# Patient Record
Sex: Male | Born: 1985 | Race: Black or African American | Hispanic: No | Marital: Married | Smoking: Never smoker
Health system: Southern US, Community
[De-identification: ages and names within clinical notes are randomized; demographics above are authoritative.]

## PROBLEM LIST (undated history)

## (undated) ENCOUNTER — Ambulatory Visit (HOSPITAL_COMMUNITY): Admission: EM | Payer: Self-pay | Source: Home / Self Care

## (undated) DIAGNOSIS — IMO0001 Reserved for inherently not codable concepts without codable children: Secondary | ICD-10-CM

## (undated) DIAGNOSIS — Z9289 Personal history of other medical treatment: Secondary | ICD-10-CM

## (undated) DIAGNOSIS — K219 Gastro-esophageal reflux disease without esophagitis: Secondary | ICD-10-CM

## (undated) HISTORY — DX: Reserved for inherently not codable concepts without codable children: IMO0001

## (undated) HISTORY — DX: Gastro-esophageal reflux disease without esophagitis: K21.9

## (undated) HISTORY — PX: APPENDECTOMY: SHX54

## (undated) HISTORY — DX: Personal history of other medical treatment: Z92.89

---
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
## 2007-01-20 ENCOUNTER — Ambulatory Visit: Payer: Self-pay | Admitting: Internal Medicine

## 2007-02-15 ENCOUNTER — Ambulatory Visit: Payer: Self-pay | Admitting: Internal Medicine

## 2007-02-18 ENCOUNTER — Ambulatory Visit: Payer: Self-pay | Admitting: Internal Medicine

## 2007-07-20 ENCOUNTER — Encounter (INDEPENDENT_AMBULATORY_CARE_PROVIDER_SITE_OTHER): Payer: Self-pay | Admitting: Urology

## 2007-07-20 ENCOUNTER — Ambulatory Visit (HOSPITAL_BASED_OUTPATIENT_CLINIC_OR_DEPARTMENT_OTHER): Admission: RE | Admit: 2007-07-20 | Discharge: 2007-07-20 | Payer: Self-pay | Admitting: Urology

## 2009-01-12 ENCOUNTER — Encounter (INDEPENDENT_AMBULATORY_CARE_PROVIDER_SITE_OTHER): Payer: Self-pay | Admitting: Adult Health

## 2009-01-12 ENCOUNTER — Ambulatory Visit: Payer: Self-pay | Admitting: Internal Medicine

## 2009-01-12 LAB — CONVERTED CEMR LAB
Chlamydia, Swab/Urine, PCR: POSITIVE — AB
GC Probe Amp, Urine: NEGATIVE

## 2009-01-13 ENCOUNTER — Encounter (INDEPENDENT_AMBULATORY_CARE_PROVIDER_SITE_OTHER): Payer: Self-pay | Admitting: Adult Health

## 2009-04-17 ENCOUNTER — Ambulatory Visit: Payer: Self-pay | Admitting: Internal Medicine

## 2009-04-17 ENCOUNTER — Encounter (INDEPENDENT_AMBULATORY_CARE_PROVIDER_SITE_OTHER): Payer: Self-pay | Admitting: Adult Health

## 2009-04-17 LAB — CONVERTED CEMR LAB
AST: 25 units/L (ref 0–37)
Albumin: 4.8 g/dL (ref 3.5–5.2)
Alkaline Phosphatase: 44 units/L (ref 39–117)
BUN: 8 mg/dL (ref 6–23)
Basophils Absolute: 0 10*3/uL (ref 0.0–0.1)
Eosinophils Absolute: 0.1 10*3/uL (ref 0.0–0.7)
Eosinophils Relative: 1 % (ref 0–5)
GC Probe Amp, Urine: NEGATIVE
Glucose, Bld: 89 mg/dL (ref 70–99)
Lymphocytes Relative: 45 % (ref 12–46)
Lymphs Abs: 1.7 10*3/uL (ref 0.7–4.0)
Neutrophils Relative %: 45 % (ref 43–77)
Platelets: 210 10*3/uL (ref 150–400)
Potassium: 4 meq/L (ref 3.5–5.3)
RDW: 13.2 % (ref 11.5–15.5)
Sodium: 142 meq/L (ref 135–145)
Total Bilirubin: 0.4 mg/dL (ref 0.3–1.2)
WBC: 3.9 10*3/uL — ABNORMAL LOW (ref 4.0–10.5)

## 2010-05-22 ENCOUNTER — Ambulatory Visit: Payer: Self-pay

## 2010-05-22 ENCOUNTER — Other Ambulatory Visit: Payer: Self-pay | Admitting: Occupational Medicine

## 2010-05-22 DIAGNOSIS — R7611 Nonspecific reaction to tuberculin skin test without active tuberculosis: Secondary | ICD-10-CM

## 2010-07-16 NOTE — Op Note (Signed)
NAMEBaker Vargas             ACCOUNT NO.:  [id number]   MEDICAL RECORD NO.:  [id number]          PATIENT TYPE:  AMB   LOCATION:  [facility]                         FACILITY:  [facility]   PHYSICIAN:  Lindaann Slough, M.D.  DATE OF BIRTH:  1985/09/27   DATE OF PROCEDURE:  07/20/2007  DATE OF DISCHARGE:                               OPERATIVE REPORT   PREOPERATIVE DIAGNOSIS:  Left orchialgia.   POSTOPERATIVE DIAGNOSIS:  Left orchialgia.   PROCEDURE DONE:  Cystoscopy, exploration of left scrotum, excision of  left appendix testis.   SURGEON:  Danae Chen, M.D.   ANESTHESIA:  General.   INDICATIONS:  The patient is a 25 year old male who has been complaining  of left testicular pain for several years.  He had a left inguinal  hernia repair about 6 years ago, and since then he has been complaining  of left testicular pain.  He was treated for left epididymitis, and the  pain has persisted.  Scrotal ultrasound showed a 0.2 cm cyst at the head  of the left epididymis.  He continued to complain of pain in spite of  scrotal block, and he insisted that the pain is coming from the upper  pole of the left testis where a small mobile structure is palpable.  I  spent several hours discussing with him about the epididymal cyst.  I  have told him that most of the time they do not cause any pain, and I do  not know if exploring the testicle and removing that structure would  relieve the pain, but he insists on having it out because he feels that  that is the cause of his pain.  He understands that he still could have  pain after the procedure, and he is willing to proceed.  That was  discussed also with Carmelia Bake, who looks after the refugees. And  he is scheduled today for the procedure.   Under general anesthesia, the patient was prepped and draped and placed  in the supine position.  The scrotum was infiltrated with 0.25%  Marcaine.  Then, a longitudinal incision was made on the scrotum.   The  incision was carried down to the tunica vaginalis, which was then  incised.  The testicle was then delivered through the wound.  The  appendix testis is present, and there is no appendix epididymis.  There  is no other structure than the appendix testis  at the upper pole of the  testis.  The appendix testis was then excised.  I carefully examined the  testicle, and I could not feel any mass in the testicle or in the  epididymis, and the cord is otherwise normal.  I carefully inspected the  tunica albuginea, and there was no evidence of cysts on the tunica.  Hemostasis was then secured with  electrocautery.  The wound was irrigated with normal saline, and the  scrotum was closed in two layers with #3-0 Vicryl.   The patient tolerated the procedure well and left the O.R. in  satisfactory condition to the post anesthesia care unit.      Marc-Henry Nesi,  M.D.  Electronically Signed     MN/MEDQ  D:  07/20/2007  T:  07/20/2007  Job:  [id number]   cc:   Fleet Contras, M.D.  Fax: [phone]

## 2010-11-27 LAB — POCT HEMOGLOBIN-HEMACUE: Operator id: 114531

## 2011-03-27 ENCOUNTER — Ambulatory Visit (INDEPENDENT_AMBULATORY_CARE_PROVIDER_SITE_OTHER): Payer: Self-pay | Admitting: Internal Medicine

## 2011-03-27 ENCOUNTER — Encounter: Payer: Self-pay | Admitting: Internal Medicine

## 2011-03-27 DIAGNOSIS — R079 Chest pain, unspecified: Secondary | ICD-10-CM

## 2011-03-27 NOTE — Patient Instructions (Signed)
STOP lifting weights.  Take Motrin 400mg   2 times per day for 1 week then  Take Motrin 200mg  2 times per day times 3 weeks.

## 2011-03-27 NOTE — Assessment & Plan Note (Signed)
Pain is atypical for cardiac.  Sounds more musculoskeletal.  That he can run for 30 min without a pain at times suggest a noncardiac source for pain. I would recomm Ibuprofen bid for several wks.  Take protonix.  Stop lifting wts.  Run as tolerated.  Call in a few wks to report response EKG done today is normal.  No evid of RVH or chamber hypertrophy.  Exam supports this.   Will be available as needed.

## 2011-03-27 NOTE — Progress Notes (Signed)
HPI Patient is a 26 year old who is referred for evaluation of CP. The patient has no known cardiac problems.  He says that since about Oct/November he has had episodes of SSchest pressure/L parasternal chest pressure.  Episodes occur with wt lifting.  Occasional can occur (mild) with running.  He can run/jog for 30 min without a problem though. Pain is not pleuritic.  Can be exacerbated if he does a flys with weights.  Lifts bout 40 lb with each hand. No fever, cough. This summer, early fall he was very active playing soccer for up to 90 min at  A time.  No Known Allergies  Current Outpatient Prescriptions  Medication Sig Dispense Refill  . pantoprazole (PROTONIX) 40 MG tablet Take 40 mg by mouth 2 (two) times daily.      . traMADol (ULTRAM) 50 MG tablet Take 50 mg by mouth every 6 (six) hours as needed.        Past Medical History  Diagnosis Date  . Chest pain   . Reflux     No past surgical history on file.  No family history on file.  History   Social History  . Marital Status: Single    Spouse Name: N/A    Number of Children: N/A  . Years of Education: N/A   Occupational History  . Not on file.   Social History Main Topics  . Smoking status: Never Smoker   . Smokeless tobacco: Not on file  . Alcohol Use: Not on file  . Drug Use: Not on file  . Sexually Active: Not on file   Other Topics Concern  . Not on file   Social History Narrative  . No narrative on file    Review of Systems:  All systems reviewed.  They are negative to the above problem except as previously stated.  Vital Signs: BP 132/78  Pulse 68  Ht 5\' 11"  (1.803 m)  Wt 192 lb (87.091 kg)  BMI 26.78 kg/m2  Physical Exam  Patient is in NAD HEENT:  Normocephalic, atraumatic. EOMI, PERRLA.  Neck: JVP is normal. No thyromegaly. No bruits.  Lungs: clear to auscultation. No rales no wheezes.  Heart: Regular rate and rhythm. Normal S1, S2. No S3.   No significant murmurs. PMI not displaced.No RV  heave Chest:  Mild chest pressure with palpitation of sternum.  Abdomen:  Supple, nontender. Normal bowel sounds. No masses. No hepatomegaly.  Extremities:   Good distal pulses throughout. No lower extremity edema.  Musculoskeletal :moving all extremities.  Neuro:   alert and oriented x3.  CN II-XII grossly intact.   EKG:  Sinus rhythm.  68 bpm.  Assessment and Plan:  ]

## 2012-09-16 ENCOUNTER — Other Ambulatory Visit (HOSPITAL_COMMUNITY): Payer: Self-pay | Admitting: Physician Assistant

## 2012-09-16 ENCOUNTER — Encounter (HOSPITAL_COMMUNITY): Payer: Self-pay | Admitting: Emergency Medicine

## 2012-09-16 ENCOUNTER — Emergency Department (HOSPITAL_COMMUNITY): Payer: BC Managed Care – PPO

## 2012-09-16 ENCOUNTER — Emergency Department (HOSPITAL_COMMUNITY)
Admission: EM | Admit: 2012-09-16 | Discharge: 2012-09-16 | Disposition: A | Discharge status: Home / Self Care | Payer: BC Managed Care – PPO | Attending: Emergency Medicine | Admitting: Emergency Medicine

## 2012-09-16 ENCOUNTER — Other Ambulatory Visit: Payer: Self-pay

## 2012-09-16 DIAGNOSIS — R079 Chest pain, unspecified: Secondary | ICD-10-CM

## 2012-09-16 DIAGNOSIS — R071 Chest pain on breathing: Secondary | ICD-10-CM | POA: Insufficient documentation

## 2012-09-16 DIAGNOSIS — R072 Precordial pain: Secondary | ICD-10-CM

## 2012-09-16 DIAGNOSIS — Z8719 Personal history of other diseases of the digestive system: Secondary | ICD-10-CM | POA: Insufficient documentation

## 2012-09-16 LAB — CBC
Hemoglobin: 15.9 g/dL (ref 13.0–17.0)
Platelets: 225 10*3/uL (ref 150–400)
RBC: 5.57 MIL/uL (ref 4.22–5.81)
WBC: 5.3 10*3/uL (ref 4.0–10.5)

## 2012-09-16 LAB — BASIC METABOLIC PANEL
BUN: 17 mg/dL (ref 6–23)
Chloride: 100 mEq/L (ref 96–112)
Creatinine, Ser: 1.03 mg/dL (ref 0.50–1.35)
GFR calc Af Amer: >90 mL/min (ref >90)
GFR calc non Af Amer: >90 mL/min (ref >90)
Potassium: 5.2 mEq/L — ABNORMAL HIGH (ref 3.5–5.1)
Sodium: 137 mEq/L (ref 135–145)

## 2012-09-16 MED ORDER — MORPHINE SULFATE 4 MG/ML IJ SOLN
4.0000 mg | Freq: Once | INTRAMUSCULAR | Status: AC
  Administered 2012-09-16: 4 mg via INTRAVENOUS
  Filled 2012-09-16: qty 1

## 2012-09-16 MED ORDER — IBUPROFEN 800 MG PO TABS: 800.0000 mg | ORAL_TABLET | Freq: Three times a day (TID) | ORAL | Status: DC

## 2012-09-16 MED ORDER — KETOROLAC TROMETHAMINE 30 MG/ML IJ SOLN
30.0000 mg | Freq: Once | INTRAMUSCULAR | Status: AC
  Administered 2012-09-16: 30 mg via INTRAVENOUS
  Filled 2012-09-16: qty 1

## 2012-09-16 NOTE — ED Provider Notes (Signed)
History    CSN: [id number] Arrival date & time 09/16/12  0207  None    Chief Complaint  Patient presents with  . Chest Pain   (Consider location/radiation/quality/duration/timing/severity/associated sxs/prior Treatment) HPI History provided by pt.   Pt c/o migrating but non-radiating L anterolateral CP.  Pain intermittent x 1 year.  Before today, it has only occurred with exertion, initially with running and lifting, eventually with walking but not every time, and tonight at rest while trying to fall asleep.  Tonight the pain is aggravated by laying on his L side as well.  No associated fever, cough, SOB, abd pain, LE edema/pain.  No RF for PE.  Per prior chart, pt had a stress echo in 03/2011 and pain determined to be musculoskeletal.   No PMH.  No FH cardiac disease.   Past Medical History  Diagnosis Date  . Chest pain   . Reflux    No past surgical history on file. No family history on file. History  Substance Use Topics  . Smoking status: Never Smoker   . Smokeless tobacco: Not on file  . Alcohol Use: No    Review of Systems  All other systems reviewed and are negative.    Allergies  Review of patient's allergies indicates no known allergies.  Home Medications   Current Outpatient Rx  Name  Route  Sig  Dispense  Refill  . fish oil-omega-3 fatty acids 1000 MG capsule   Oral   Take 1 g by mouth daily as needed (Takes only when he feels chest pain-2-3 times per week).         Marland Kitchen ibuprofen (ADVIL,MOTRIN) 200 MG tablet   Oral   Take 400 mg by mouth every 6 (six) hours as needed for pain.          BP 135/90  Pulse 58  Temp(Src) 98.3 F (36.8 C) (Oral)  Resp 18  SpO2 99% Physical Exam  Nursing note and vitals reviewed. Constitutional: He is oriented to person, place, and time. He appears well-developed and well-nourished. No distress.  HENT:  Head: Normocephalic and atraumatic.  Eyes:  Normal appearance  Neck: Normal range of motion.  Cardiovascular:  Normal rate, regular rhythm and intact distal pulses.   Pulmonary/Chest: Effort normal and breath sounds normal. No respiratory distress.  Mild tenderness L antero-lateral chest.  No pleuritic pain reported  Abdominal: Soft. Bowel sounds are normal. He exhibits no distension. There is no tenderness. There is no guarding.  Musculoskeletal: Normal range of motion.  No peripheral edema or calf tenderness  Neurological: He is alert and oriented to person, place, and time.  Skin: Skin is warm and dry. No rash noted.  Psychiatric: He has a normal mood and affect. His behavior is normal.    ED Course  Procedures (including critical care time)  Date: 09/16/2012  Rate: 61  Rhythm: normal sinus rhythm  QRS Axis: normal  Intervals: normal  ST/T Wave abnormalities: normal  Conduction Disutrbances:none  Narrative Interpretation:   Old EKG Reviewed: unchanged   Labs Reviewed  BASIC METABOLIC PANEL - Abnormal; Notable for the following:    Potassium 5.2 (*)    All other components within normal limits  CBC  PRO B NATRIURETIC PEPTIDE  TROPONIN I   Dg Chest 2 View  09/16/2012   *RADIOLOGY REPORT*  Clinical Data: Left-sided chest pain, radiating down the left arm.  CHEST - 2 VIEW  Comparison: Chest radiograph performed 05/22/2010  Findings: The lungs are well-aerated and  clear.  There is no evidence of focal opacification, pleural effusion or pneumothorax.  The heart is normal in size; the mediastinal contour is within normal limits.  No acute osseous abnormalities are seen.  IMPRESSION: No acute cardiopulmonary process seen.   Original Report Authenticated By: Tonia Ghent, M.D.   1. Chest pain     MDM  27yo healthy M presents w/ intermittent, gradually worsening, exertional CP that resolves w/ rest x 1 year,  Pain occurred at rest last night for first time.  Reportedly had an exercise stress echo in 12/2011.  No record of this exam in prior chart, but there is a note from [facility]  at that time that reports suspected musculoskeletal etiology of pain.  No RF for or exam findings consistent w/ PE.  EKG unchanged from prior and labs unremarkable.   Consulted cardiology fellow who is happy to arrange for outpatient f/u; pt will likely need another echo.  I recommended that he minimize his exertion in the meantime.  Prescribed 800mg  ibuprofen.  Return precautions discussed.   Otilio Miu, PA-C 09/16/12 [phone]

## 2012-09-16 NOTE — ED Notes (Signed)
PT. REPORTS LEFT CHEST PAIN / PALPITATIONS ONSET HIS EVENING WITH SLIGHT SOB , DENIES NAUSEA OR DIAPHORESIS.

## 2012-09-17 NOTE — ED Provider Notes (Signed)
Medical screening examination/treatment/procedure(s) were performed by non-physician practitioner and as supervising physician I was immediately available for consultation/collaboration.  Cardiology consulted Dr Tresa Endo recommends and will help arrange for outpatient echo to further evaluate.   Sunnie Nielsen, MD 09/17/12 [phone]

## 2012-09-21 ENCOUNTER — Ambulatory Visit (HOSPITAL_COMMUNITY): Payer: BC Managed Care – PPO | Attending: Cardiology

## 2012-09-21 DIAGNOSIS — R079 Chest pain, unspecified: Secondary | ICD-10-CM | POA: Insufficient documentation

## 2012-09-21 DIAGNOSIS — R072 Precordial pain: Secondary | ICD-10-CM

## 2012-09-21 NOTE — Progress Notes (Signed)
Echocardiogram performed.  

## 2012-09-23 ENCOUNTER — Encounter: Payer: Self-pay | Admitting: Physician Assistant

## 2012-09-24 ENCOUNTER — Telehealth: Payer: Self-pay | Admitting: *Deleted

## 2012-09-24 NOTE — Telephone Encounter (Signed)
lmom echo normal. Order was placed under [employer]. PA but he did not see this pt nor was Scott in the office at the time of the order was placed.

## 2012-09-24 NOTE — Telephone Encounter (Signed)
Message copied by Tarri Fuller on Fri Sep 24, 2012  9:29 AM ------      Message from: [place], [place] T      Created: Thu Sep 23, 2012 10:40 PM       I did not see this patient and did not order this echo.      Looks like he was seen in the ED with CP      Echo is normal with normal LVF      Please notify patient      Tereso Newcomer, Cordelia Poche        09/23/2012 10:39 PM ------

## 2012-10-04 ENCOUNTER — Ambulatory Visit (INDEPENDENT_AMBULATORY_CARE_PROVIDER_SITE_OTHER): Payer: BC Managed Care – PPO | Admitting: Physician Assistant

## 2012-10-04 ENCOUNTER — Encounter: Payer: Self-pay | Admitting: Physician Assistant

## 2012-10-04 VITALS — BP 120/80 | HR 67 | Ht 69.0 in | Wt 190.0 lb

## 2012-10-04 DIAGNOSIS — R079 Chest pain, unspecified: Secondary | ICD-10-CM | POA: Insufficient documentation

## 2012-10-04 MED ORDER — IBUPROFEN 800 MG PO TABS: 800.0000 mg | ORAL_TABLET | Freq: Three times a day (TID) | ORAL | Status: DC | PRN

## 2012-10-04 NOTE — Progress Notes (Signed)
[address] [place], [place]  [address] Phone: [phone] Fax:  [phone]  Date:  10/04/2012   ID:  Jason Vargas, DOB 1985/04/24, MRN [id number]  PCP:  Jaclyn Shaggy, MD  Cardiologist:  Dr. Dietrich Pates     History of Present Illness: Jason Vargas is a 27 y.o. male who returns for f/u after a recent trip to the ED.  He saw Dr. Dietrich Pates in 03/2011 for CP.  He noted pain more with lifting weights and sometimes with running (although he could run for 30 mins without a problem).  Symptoms felt to be atypical.  No further workup was recommended and he was asked to f/u PRN.  He was seen in the ED 09/16/12 with CP.  CEs and CXR were normal.  He was set up for an echocardiogram and asked to f/u here.  Echo 7/14:  EF 60-65%, normal wall motion, normal diastolic function.  He notes substernal CP that occurs with positional changes.  Also notes L sided CP with lying on L side.  Plays soccer few times a week without significant CP.  Notes some DOE.  But, describes NYHA Class I-II symptoms. No orthopnea, PND, edema.  No syncope.  No pleuritic CP.  No CP with lying supine.    Labs (7/14):  K 5.2, Cr 1.03, proBNP < 5, Hgb 15.9   Wt Readings from Last 3 Encounters:  10/04/12 190 lb (86.183 kg)  03/27/11 192 lb (87.091 kg)     Past Medical History  Diagnosis Date  . Chest pain   . Reflux   . Hx of echocardiogram     Echo 7/14: EF 60-65%, normal WM    Current Outpatient Prescriptions  Medication Sig Dispense Refill  . fish oil-omega-3 fatty acids 1000 MG capsule Take 1 g by mouth daily as needed (Takes only when he feels chest pain-2-3 times per week).      Marland Kitchen ibuprofen (ADVIL,MOTRIN) 800 MG tablet Take 1 tablet (800 mg total) by mouth 3 (three) times daily.  12 tablet  0   No current facility-administered medications for this visit.    Allergies:   No Known Allergies  Social History:  The patient  reports that he has never smoked. He does not have any smokeless  tobacco history on file. He reports that he does not drink alcohol or use illicit drugs.   Family History:  The patient's family history is negative for Heart attack and Sudden death.   ROS:  Please see the history of present illness.      All other systems reviewed and negative.   PHYSICAL EXAM: VS:  BP 120/80  Pulse 67  Ht 5\' 9"  (1.753 m)  Wt 190 lb (86.183 kg)  BMI 28.05 kg/m2 Well nourished, well developed, in no acute distress HEENT: normal Neck: no JVD Cardiac:  normal S1, S2; RRR; no murmur Chest:  Minor pain with palpation over sternum Lungs:  clear to auscultation bilaterally, no wheezing, rhonchi or rales Abd: soft, nontender, no hepatomegaly Ext: no edema Skin: warm and dry Neuro:  CNs 2-12 intact, no focal abnormalities noted  EKG:  NSR, HR 67, normal axis, no acute changes     ASSESSMENT AND PLAN:  1. Chest Pain:  Symptom are improved.  Pain is very atypical for ischemia.  He was given Ibuprofen in the ED and this has helped.  Will refill this x 1.  He can take PRN.  He has seen Dr. Tenny Craw in 2013  and now has gone to the ED for CP.  We discussed proceeding with a plain ETT (POET).  However, he would like to hold off on this.   2. Disposition:  F/u with Dr. Dietrich Pates prn.  We discussed establishing with a PCP for further evaluation and management.    Signed, Tereso Newcomer, PA-C  10/04/2012 4:03 PM

## 2012-10-04 NOTE — Patient Instructions (Addendum)
REFILL SENT INTO YOUR PHARMACY FOR IBUPROFEN 800MG .(WITH FOOD) TAKE 3 TIMES DAILY AS NEEDED FOR PAIN  YOU CAN APPLY HEAT TO YOUR CHEST(WARM WASHCLOTH) AS NEEDED FOR CHEST PAIN  FOLLOW UP AS NEEDED

## 2012-10-22 ENCOUNTER — Ambulatory Visit: Payer: Self-pay | Admitting: Internal Medicine

## 2014-03-01 ENCOUNTER — Emergency Department (INDEPENDENT_AMBULATORY_CARE_PROVIDER_SITE_OTHER)
Admission: EM | Admit: 2014-03-01 | Discharge: 2014-03-01 | Disposition: A | Discharge status: Home / Self Care | Payer: PRIVATE HEALTH INSURANCE | Source: Home / Self Care | Attending: Family Medicine | Admitting: Family Medicine

## 2014-03-01 ENCOUNTER — Encounter (HOSPITAL_COMMUNITY): Payer: Self-pay

## 2014-03-01 MED ORDER — DICLOFENAC POTASSIUM 50 MG PO TABS: 50.0000 mg | ORAL_TABLET | Freq: Three times a day (TID) | ORAL | Status: DC

## 2014-03-01 MED ORDER — CYCLOBENZAPRINE HCL 5 MG PO TABS: 5.0000 mg | ORAL_TABLET | Freq: Three times a day (TID) | ORAL | Status: DC | PRN
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
## 2014-03-01 NOTE — Discharge Instructions (Signed)
Heat, stretch and medicine as discussed. See your doctor if further problems.
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
## 2015-09-01 ENCOUNTER — Ambulatory Visit: Payer: Self-pay | Admitting: Family Medicine

## 2015-09-16 ENCOUNTER — Encounter (HOSPITAL_COMMUNITY): Payer: Self-pay | Admitting: Emergency Medicine

## 2015-09-16 ENCOUNTER — Ambulatory Visit (HOSPITAL_COMMUNITY)
Admission: EM | Admit: 2015-09-16 | Discharge: 2015-09-16 | Disposition: A | Discharge status: Home / Self Care | Payer: Self-pay | Attending: Family Medicine | Admitting: Family Medicine

## 2015-09-16 DIAGNOSIS — S43401A Unspecified sprain of right shoulder joint, initial encounter: Secondary | ICD-10-CM

## 2015-09-16 MED ORDER — MENTHOL (TOPICAL ANALGESIC) 2.5 % EX GEL: CUTANEOUS | Status: DC

## 2015-09-16 MED ORDER — DICLOFENAC POTASSIUM 50 MG PO TABS: 50.0000 mg | ORAL_TABLET | Freq: Two times a day (BID) | ORAL | Status: DC

## 2015-09-16 NOTE — ED Notes (Signed)
[facility] a [facility] a right shoulder injury and pain for 3 weeks. The patient stated that he believed he injured the shoulder in the gym.

## 2015-09-16 NOTE — ED Provider Notes (Signed)
CSN: [id number]     Arrival date & time 09/16/15  1219 History   First MD Initiated Contact with Patient 09/16/15 1449     Chief Complaint  Patient presents with  . Shoulder Pain   (Consider location/radiation/quality/duration/timing/severity/associated sxs/prior Treatment) Patient is a 30 y.o. male presenting with shoulder pain. The history is provided by the patient. No language interpreter was used.  Shoulder Pain Location:  Shoulder Time since incident:  3 weeks Injury: yes   Mechanism of injury comment:  He was doing pushing at the gym and he felt like he pulled a muscle Shoulder location:  R shoulder Pain details:    Quality:  Aching   Radiates to:  Does not radiate   Severity:  Moderate (8/10 in severity)   Onset quality:  Sudden   Duration:  3 weeks   Timing:  Constant   Progression:  Unchanged Chronicity:  New Handedness:  Right-handed Dislocation: no   Prior injury to area:  No Relieved by:  Nothing Worsened by:  Nothing tried Ineffective treatments:  Ice Associated symptoms: no fatigue, no fever, no numbness, no swelling and no tingling     Past Medical History  Diagnosis Date  . Chest pain   . Reflux   . Hx of echocardiogram     Echo 7/14: EF 60-65%, normal WM   History reviewed. No pertinent past surgical history. Family History  Problem Relation Age of Onset  . Heart attack Neg Hx   . Sudden death Neg Hx    Social History  Substance Use Topics  . Smoking status: Never Smoker   . Smokeless tobacco: None  . Alcohol Use: No    Review of Systems  Constitutional: Negative for fever and fatigue.  Respiratory: Negative.   Cardiovascular: Negative.   Musculoskeletal: Positive for arthralgias. Negative for joint swelling.  All other systems reviewed and are negative.   Allergies  Review of patient's allergies indicates no known allergies.  Home Medications   Prior to Admission medications   Medication Sig Start Date End Date Taking? Authorizing  Provider  cyclobenzaprine (FLEXERIL) 5 MG tablet Take 1 tablet (5 mg total) by mouth 3 (three) times daily as needed for muscle spasms. 03/01/14   Linna HoffJames D Kindl, MD  diclofenac (CATAFLAM) 50 MG tablet Take 1 tablet (50 mg total) by mouth 3 (three) times daily. For back pain 03/01/14   Linna HoffJames D Kindl, MD  fish oil-omega-3 fatty acids 1000 MG capsule Take 1 g by mouth daily as needed (Takes only when he feels chest pain-2-3 times per week).    Historical Provider, MD  ibuprofen (ADVIL,MOTRIN) 800 MG tablet Take 1 tablet (800 mg total) by mouth every 8 (eight) hours as needed for pain (WITH FOOD). 10/04/12   Beatrice LecherScott T Weaver, PA-C   Meds Ordered and Administered this Visit  Medications - No data to display  BP 120/80 mmHg  Pulse 56  Temp(Src) 98.2 F (36.8 C) (Oral)  Resp 18  SpO2 100% No data found.   Physical Exam  Constitutional: He appears well-developed. No distress.  Cardiovascular: Normal rate, regular rhythm and normal heart sounds.   No murmur heard. Pulmonary/Chest: Effort normal and breath sounds normal. No respiratory distress. He has no wheezes.  Musculoskeletal:       Right shoulder: He exhibits tenderness. He exhibits normal range of motion, no bony tenderness, no swelling, no crepitus, no deformity, no spasm and normal strength.  Nursing note and vitals reviewed.   ED Course  Procedures (  including critical care time)  Labs Review Labs Reviewed - No data to display  Imaging Review No results found.   Visual Acuity Review  Right Eye Distance:   Left Eye Distance:   Bilateral Distance:    Right Eye Near:   Left Eye Near:    Bilateral Near:         MDM  No diagnosis found. Shoulder sprain, right, initial encounter  Patient reassured this should get better in few days to week. Avoid push ups for now. May message the area with Bengay ointment OTC. Diclofenac prescribed for pain. F/U soon if no improvement.    Doreene Eland, MD 09/16/15 1501

## 2015-09-16 NOTE — Discharge Instructions (Signed)
Shoulder Sprain °A shoulder sprain is a partial or complete tear in one of the tough, fiber-like tissues (ligaments) in the shoulder. The ligaments in the shoulder help to hold the shoulder in place. °CAUSES °This condition may be caused by: °· A fall. °· A hit to the shoulder. °· A twist of the arm. °RISK FACTORS °This condition is more likely to develop in: °· People who play sports. °· People who have problems with balance or coordination. °SYMPTOMS °Symptoms of this condition include: °· Pain when moving the shoulder. °· Limited ability to move the shoulder. °· Swelling and tenderness on top of the shoulder. °· Warmth in the shoulder. °· A change in the shape of the shoulder. °· Redness or bruising on the shoulder. °DIAGNOSIS °This condition is diagnosed with a physical exam. During the exam, you may be asked to do simple exercises with your shoulder. You may also have imaging tests, such as X-rays, MRI, or a CT scan. These tests can show how severe the sprain is. °TREATMENT °This condition may be treated with: °· Rest. °· Pain medicine. °· Ice. °· A sling or brace. This is used to keep the arm still while the shoulder is healing. °· Physical therapy or rehabilitation exercises. These help to improve the range of motion and strength of the shoulder. °· Surgery (rare). Surgery may be needed if the sprain caused a joint to become unstable. Surgery may also be needed to reduce pain. °Some people may develop ongoing shoulder pain or lose some range of motion in the shoulder. However, most people do not develop long-term problems. °HOME CARE INSTRUCTIONS °· Rest. °· Take over-the-counter and prescription medicines only as told by your health care provider. °· If directed, apply ice to the area: °¨ Put ice in a plastic bag. °¨ Place a towel between your skin and the bag. °¨ Leave the ice on for 20 minutes, 2-3 times per day. °· If you were given a shoulder sling or brace: °¨ Wear it as told. °¨ Remove it to shower or  bathe. °¨ Move your arm only as much as told by your health care provider, but keep your hand moving to prevent swelling. °· If you were shown how to do any exercises, do them as told by your health care provider. °· Keep all follow-up visits as told by your health care provider. This is important. °SEEK MEDICAL CARE IF: °· Your pain gets worse. °· Your pain is not relieved with medicines. °· You have increased redness or swelling. °SEEK IMMEDIATE MEDICAL CARE IF: °· You have a fever. °· You cannot move your arm or shoulder. °· You develop numbness or tingling in your arms, hands, or fingers. °  °This information is not intended to replace advice given to you by your health care provider. Make sure you discuss any questions you have with your health care provider. °  °Document Released: 07/06/2008 Document Revised: 11/08/2014 Document Reviewed: 06/12/2014 °Elsevier Interactive Patient Education ©2016 [employer] ° °

## 2015-10-12 ENCOUNTER — Encounter (HOSPITAL_COMMUNITY): Payer: Self-pay | Admitting: Emergency Medicine

## 2015-10-12 ENCOUNTER — Ambulatory Visit (HOSPITAL_COMMUNITY)
Admission: EM | Admit: 2015-10-12 | Discharge: 2015-10-12 | Disposition: A | Discharge status: Home / Self Care | Payer: Self-pay | Attending: Family Medicine | Admitting: Family Medicine

## 2015-10-12 DIAGNOSIS — M67911 Unspecified disorder of synovium and tendon, right shoulder: Secondary | ICD-10-CM

## 2015-10-12 MED ORDER — DICLOFENAC POTASSIUM 50 MG PO TABS: 50.0000 mg | ORAL_TABLET | Freq: Two times a day (BID) | ORAL | 0 refills | Status: DC

## 2015-10-12 NOTE — ED Triage Notes (Signed)
C/o persistent right shoulder pain onset x1 month... Seen here on 09/16/15 and was given diclofenac w/no relief... Has been applying ice compression as well...   Reports he has restarted gym work outs about a 1 week ago... Does not have a PCP  A&O x4... NAD.

## 2015-10-12 NOTE — Discharge Instructions (Signed)
Most shoulder pain is caused by soft tissue problems rather than arthritis.  Rotator cuff tendonitis or tendonosis, rotator cuff tears, impingement syndrome and cartilege (labrum tears) are a few of the common causes of shoulder pain.  Fortunately, most of these can be treated with conservative measures as outlined below.  Do not do the following: Any work with the arms above shoulder level (especially lifting) until the pain has subsided. Sleep on the affected side.  Especially avoid sleeping with your arm under your head or your pillow.  This is a habit that is hard to break.   Do the following: Rest the shoulder but continue range of motion exercises.  Call Sports Medicine to schedule an appointment.  Use of over the counter pain meds can be of help.  Tylenol (or acetaminophen) is the safest to use.  It often helps to take this regularly.  You can take up to 325 mg tablets, 2 at a time, 5 times daily.

## 2015-10-12 NOTE — ED Provider Notes (Signed)
[facility]    CSN: [id number] Arrival date & time: 10/12/15  1035  First Provider Contact:  First MD Initiated Contact with Patient 10/12/15 1054     History   Chief Complaint Chief Complaint  Patient presents with  . Shoulder Pain   HPI Jason Vargas is a 30 y.o. right hand-dominant male returning for evaluation of right shoulder pain.   He reports several months of worsening, moderate-severe, waxing/waning, "aching," right anterior/superior shoulder pain worse with over head movement that he noticed when performing bench press at the gym. He has sought medical attention for this several times and was told to stop working out for 1 week, during which time his symptoms improved, but returned when he went to the gym 2 days ago. He lifts heavy boxes at work and does not think he can lift them for work today. He denies any history of injury to the right shoulder. No neck pain. No radiation.   Past Medical History:  Diagnosis Date  . Chest pain   . Hx of echocardiogram    Echo 7/14: EF 60-65%, normal WM  . Reflux    Patient Active Problem List   Diagnosis Date Noted  . Chest pain, unspecified 10/04/2012   History reviewed. No pertinent surgical history.  Home Medications    Prior to Admission medications   Medication Sig Start Date End Date Taking? Authorizing Provider  diclofenac (CATAFLAM) 50 MG tablet Take 1 tablet (50 mg total) by mouth 2 (two) times daily. For back pain 10/12/15   Tyrone Nine, MD  fish oil-omega-3 fatty acids 1000 MG capsule Take 1 g by mouth daily as needed (Takes only when he feels chest pain-2-3 times per week).    Historical Provider, MD  Menthol, Topical Analgesic, (BENGAY PAIN RELIEF + MASSAGE) 2.5 % GEL Apply to affected area as needed daily. 09/16/15   Doreene Eland, MD    Family History Family History  Problem Relation Age of Onset  . Heart attack Neg Hx   . Sudden death Neg Hx     Social History Social History  Substance  Use Topics  . Smoking status: Never Smoker  . Smokeless tobacco: Never Used  . Alcohol use No     Allergies   Review of patient's allergies indicates no known allergies.   Review of Systems Review of Systems As above  Physical Exam Triage Vital Signs ED Triage Vitals  Enc Vitals Group     BP 10/12/15 1113 132/76     Pulse Rate 10/12/15 1113 64     Resp 10/12/15 1113 18     Temp 10/12/15 1113 98.5 F (36.9 C)     Temp Source 10/12/15 1113 Oral     SpO2 10/12/15 1113 100 %     Weight --      Height --      Head Circumference --      Peak Flow --      Pain Score 10/12/15 1114 8     Pain Loc --      Pain Edu? --      Excl. in GC? --    No data found.   Updated Vital Signs BP 132/76 (BP Location: Left Arm)   Pulse 64   Temp 98.5 F (36.9 C) (Oral)   Resp 18   SpO2 100%   Physical Exam  Constitutional: He is oriented to person, place, and time. He appears well-developed and well-nourished. No distress.  Eyes: EOM are  normal. Pupils are equal, round, and reactive to light. No scleral icterus.  Neck: Neck supple. No JVD present.  Cardiovascular: Normal rate, regular rhythm, normal heart sounds and intact distal pulses.   No murmur heard. Pulmonary/Chest: Effort normal and breath sounds normal. No respiratory distress.  Abdominal: Soft. Bowel sounds are normal. He exhibits no distension. There is no tenderness.  Musculoskeletal:  Right shoulder: Inspection reveals no deformities. Good, symmetric muscle bulk and tone. + tenderness over coracoid process with no tenderness over AC joint or bicipital groove.ROM is full in all planes, but painful with abduction. Positive empty can test. No signs of impingement with negative Neer and Hawkin's tests. Negative Obrien's. +Painful arc.  Lymphadenopathy:    He has no cervical adenopathy.  Neurological: He is alert and oriented to person, place, and time. He exhibits normal muscle tone.  Skin: Skin is warm and dry.  Vitals  reviewed.  UC Treatments / Results  Labs (all labs ordered are listed, but only abnormal results are displayed) Labs Reviewed - No data to display  EKG  EKG Interpretation None       Radiology No results found.  Procedures Procedures (including critical care time)  Medications Ordered in UC Medications - No data to display   Initial Impression / Assessment and Plan / UC Course  I have reviewed the triage vital signs and the nursing notes.  Pertinent labs & imaging results that were available during my care of the patient were reviewed by me and considered in my medical decision making (see chart for details).  Final Clinical Impressions(s) / UC Diagnoses   Final diagnoses:  Tendinopathy of right rotator cuff   30 y.o. male with ongoing shoulder pain and exam findings consistent with rotator cuff tendinopathy.  - [facility] - ice, topical Tx (bengay), and NSAID prn - Relative rest (especially overhead lifting) including no significant gym workouts with right shoulder x 6 weeks or until seen by SM.  - Reviewed ROM exercises  New Prescriptions Current Discharge Medication List       Tyrone Nineyan B Montey Ebel, MD 10/12/15 1144

## 2016-03-27 ENCOUNTER — Encounter (HOSPITAL_COMMUNITY): Payer: Self-pay | Admitting: Emergency Medicine

## 2016-03-27 ENCOUNTER — Ambulatory Visit (HOSPITAL_COMMUNITY)
Admission: EM | Admit: 2016-03-27 | Discharge: 2016-03-27 | Disposition: A | Discharge status: Home / Self Care | Payer: Self-pay | Attending: Family Medicine | Admitting: Family Medicine

## 2016-03-27 DIAGNOSIS — R1013 Epigastric pain: Secondary | ICD-10-CM

## 2016-03-27 MED ORDER — CIPROFLOXACIN HCL 500 MG PO TABS: 500.0000 mg | ORAL_TABLET | Freq: Two times a day (BID) | ORAL | 0 refills | Status: DC

## 2016-03-27 MED ORDER — GI COCKTAIL ~~LOC~~
30.0000 mL | Freq: Once | ORAL | Status: AC
  Administered 2016-03-27: 30 mL via ORAL

## 2016-03-27 MED ORDER — GI COCKTAIL ~~LOC~~
ORAL | Status: AC
  Filled 2016-03-27: qty 30
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
## 2016-03-27 NOTE — Discharge Instructions (Signed)
Due to your recent travel, I am testing you for intestinal parasites. Collect the stool as instructed and return it to the clinic as soon as possible. If positive, medicine will be started to treat any parasites that were found. I am giving you a GI cocktail in clinic of lidocaine, maalox, and donnatal and sending a prescription of Cipro to your pharmacy. Take 1 tablet twice a day for 7 days. If you continue to have symptoms, I would recommend following up with an infectious disease specialist for further evaluation.

## 2016-03-27 NOTE — ED Triage Notes (Addendum)
Generalized aching for 3-4 days.  Has felt fatigued and weak.  Center abdominal pain, epigastric area.  Patient did have a normal bm today per patient

## 2016-10-11 ENCOUNTER — Ambulatory Visit (HOSPITAL_COMMUNITY)
Admission: EM | Admit: 2016-10-11 | Discharge: 2016-10-11 | Disposition: A | Discharge status: Home / Self Care | Payer: Self-pay | Attending: Family Medicine | Admitting: Family Medicine

## 2016-10-11 ENCOUNTER — Ambulatory Visit (INDEPENDENT_AMBULATORY_CARE_PROVIDER_SITE_OTHER): Payer: Self-pay

## 2016-10-11 ENCOUNTER — Encounter (HOSPITAL_COMMUNITY): Payer: Self-pay | Admitting: Family Medicine

## 2016-10-11 DIAGNOSIS — J3489 Other specified disorders of nose and nasal sinuses: Secondary | ICD-10-CM

## 2016-10-11 NOTE — Discharge Instructions (Signed)
There are no fractures, no dislocations seen on the x-ray. Your nasal bones are not injured. Soft tissue swelling may be a result of her injury. He can take over-the-counter Tylenol or Motrin as needed for pain. Otherwise follow-up with her regular doctor 2-3 weeks as needed.

## 2016-10-11 NOTE — ED Triage Notes (Signed)
Pt here for nose injury from playing soccer. Swelling to nose,.

## 2016-10-11 NOTE — ED Provider Notes (Signed)
  [facility]   [id number] 10/11/16 Arrival Time: 1736  ASSESSMENT & PLAN:  1. Nasal pain     Reviewed expectations re: course of current medical issues. Questions answered. Outlined signs and symptoms indicating need for more acute intervention. Patient verbalized understanding. After Visit Summary given.   SUBJECTIVE:  Jason MaidensKenny Janssen is a 31 y.o. male who presents with complaint of Swelling to his nose. States he had an injury to his face 2 years ago while in [place] where he got struck in the face with a soccer ball. He has had intermittent swelling since. He is here most recently for reinjury of this injury, he was playing another game 4 days ago when he caught an "elbowed" to the face. There is some swelling to the left nasal bridge, no difficulty breathing, his had no bleeding, or other issues.  ROS: As per HPI, remainder of ROS negative.   OBJECTIVE:  Vitals:   10/11/16 1831  BP: (!) 149/108  Pulse: 63  Resp: 18  Temp: 98.5 F (36.9 C)  SpO2: 100%     General appearance: alert; no distress HEENT: normocephalic; atraumatic; conjunctivae normal; Nasal septum appears midline, there is no lacerations within the naris, sinuses are nontender, no visible bruising noted. Neck: Negative Lungs: clear to auscultation bilaterally Heart: regular rate and rhythm Abdomen: soft, non-tender;  Back: No abnormalities Extremities: no cyanosis or edema; symmetrical with no gross deformities Skin: warm and dry Neurologic: Grossly normal Psychological:  alert and cooperative; normal mood and affect  Procedures:  Labs Reviewed - No data to display  Dg Nasal Bones  Result Date: 10/11/2016 CLINICAL DATA:  Injury playing soccer EXAM: NASAL BONES - 3+ VIEW COMPARISON:  None. FINDINGS: There is no evidence of fracture or other bone abnormality. IMPRESSION: Negative. Electronically Signed   By: Marlan Palauharles  Clark M.D.   On: 10/11/2016 19:29    No Known Allergies  PMHx, SurgHx,  SocialHx, Medications, and Allergies were reviewed in the Visit Navigator and updated as appropriate.       Dorena BodoKennard, Jason Vanderheyden, NP 10/11/16 2101

## 2017-08-07 ENCOUNTER — Encounter (HOSPITAL_COMMUNITY): Payer: Self-pay | Admitting: Family Medicine

## 2017-08-07 ENCOUNTER — Ambulatory Visit (HOSPITAL_COMMUNITY)
Admission: EM | Admit: 2017-08-07 | Discharge: 2017-08-07 | Disposition: A | Discharge status: Home / Self Care | Payer: Self-pay | Attending: Family Medicine | Admitting: Family Medicine

## 2017-08-07 DIAGNOSIS — S161XXA Strain of muscle, fascia and tendon at neck level, initial encounter: Secondary | ICD-10-CM

## 2017-08-07 DIAGNOSIS — R51 Headache: Secondary | ICD-10-CM

## 2017-08-07 DIAGNOSIS — M62838 Other muscle spasm: Secondary | ICD-10-CM

## 2017-08-07 MED ORDER — CYCLOBENZAPRINE HCL 10 MG PO TABS: 10.0000 mg | ORAL_TABLET | Freq: Every day | ORAL | 0 refills | Status: DC

## 2017-08-07 MED ORDER — NAPROXEN 500 MG PO TABS: 500.0000 mg | ORAL_TABLET | Freq: Two times a day (BID) | ORAL | 0 refills | Status: DC

## 2017-08-07 NOTE — Discharge Instructions (Addendum)
Rest, ice and heat as needed °Ensure adequate ROM as tolerated. °Injuries all appear to be muscular in nature. °Prescribed naproxen as needed for inflammation and pain relief °Prescribed flexeril as needed at bedtime for muscle spasm.  Do not drive or operate heavy machinery while taking this medication °Expect some increased pain in the next 1-3 days.  °It may take 3-4 weeks for complete resolution of symptoms °Will f/u with his doctor or here if not seeing significant improvement within one week. °Return here or go to ER if you have any new or worsening symptoms such as numbness/tingling of the inner thighs, loss of bladder or bowel control, headache/blurry vision, nausea/vomiting, confusion/altered mental status, dizziness, weakness, passing out, imbalance, etc... °
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
## 2017-08-28 ENCOUNTER — Other Ambulatory Visit: Payer: Self-pay

## 2017-08-28 ENCOUNTER — Ambulatory Visit (HOSPITAL_COMMUNITY)
Admission: EM | Admit: 2017-08-28 | Discharge: 2017-08-28 | Disposition: A | Discharge status: Home / Self Care | Payer: Self-pay | Attending: Family Medicine | Admitting: Family Medicine

## 2017-08-28 ENCOUNTER — Encounter (HOSPITAL_COMMUNITY): Payer: Self-pay | Admitting: *Deleted

## 2017-08-28 DIAGNOSIS — R1013 Epigastric pain: Secondary | ICD-10-CM

## 2017-08-28 DIAGNOSIS — R110 Nausea: Secondary | ICD-10-CM

## 2017-08-28 MED ORDER — OMEPRAZOLE 20 MG PO CPDR: 20.0000 mg | DELAYED_RELEASE_CAPSULE | Freq: Every day | ORAL | 0 refills | Status: DC

## 2017-08-28 MED ORDER — GI COCKTAIL ~~LOC~~
ORAL | Status: AC
  Filled 2017-08-28: qty 30

## 2017-08-28 MED ORDER — GI COCKTAIL ~~LOC~~
30.0000 mL | Freq: Once | ORAL | Status: AC
  Administered 2017-08-28: 30 mL via ORAL

## 2017-08-28 MED ORDER — ONDANSETRON 4 MG PO TBDP: 4.0000 mg | ORAL_TABLET | Freq: Three times a day (TID) | ORAL | 0 refills | Status: DC | PRN

## 2017-08-28 NOTE — ED Provider Notes (Signed)
[facility]    CSN: [id number] Arrival date & time: 08/28/17  1246     History   Chief Complaint Chief Complaint  Patient presents with  . Abdominal Pain    HPI Jason Vargas is a 32 y.o. male history of reflux resenting today for evaluation of epigastric abdominal pain and nausea.  Patient has had symptoms for approximately 5 days.  He notes that they started after [place].  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  He has had nausea as well as fatigue, but denies vomiting.  Usually pain is epigastric, but will become lower abdominal with eating.  Tolerating oral intake.  Denies diarrhea.  Last bowel movement was yesterday, notes that his bowels have not been as frequent and has been harder than normal.  He was only able to have a small bowel movement yesterday.  Denies chest pain, shortness of breath, chest burning.  Denies coughing.  Denies URI symptoms and fevers.  HPI  Past Medical History:  Diagnosis Date  . Chest pain   . Hx of echocardiogram    Echo 7/14: EF 60-65%, normal WM  . Reflux     Patient Active Problem List   Diagnosis Date Noted  . Chest pain, unspecified 10/04/2012    History reviewed. No pertinent surgical history.     Home Medications    Prior to Admission medications   Medication Sig Start Date End Date Taking? Authorizing Provider  fish oil-omega-3 fatty acids 1000 MG capsule Take 1 g by mouth daily as needed (Takes only when he feels chest pain-2-3 times per week).   Yes [provider]  cyclobenzaprine (FLEXERIL) 10 MG tablet Take 1 tablet (10 mg total) by mouth at bedtime. 08/07/17   Wurst, [place], PA-C  ibuprofen (ADVIL,MOTRIN) 200 MG tablet Take 200 mg by mouth every 6 (six) hours as needed.    [provider]  naproxen (NAPROSYN) 500 MG tablet Take 1 tablet (500 mg total) by mouth 2 (two) times  daily. 08/07/17   Wurst, [place], PA-C  omeprazole (PRILOSEC) 20 MG capsule Take 1 capsule (20 mg total) by mouth daily for 14 days. 08/28/17 09/11/17  Wieters, Hallie C, PA-C  ondansetron (ZOFRAN ODT) 4 MG disintegrating tablet Take 1 tablet (4 mg total) by mouth every 8 (eight) hours as needed for nausea or vomiting. 08/28/17   Wieters, Junius CreamerHallie C, PA-C    Family History Family History  Problem Relation Age of Onset  . Healthy Mother   . Healthy Father   . Heart attack Neg Hx   . Sudden death Neg Hx     Social History Social History   Tobacco Use  . Smoking status: Former Games developermoker  . Smokeless tobacco: Never Used  Substance Use Topics  . Alcohol use: Yes  . Drug use: No     Allergies   Patient has no known allergies.   Review of Systems Review of Systems  Constitutional: Negative for activity change, appetite change, chills, fatigue and fever.  HENT: Negative for congestion, ear pain, rhinorrhea, sinus pressure, sore throat and trouble swallowing.   Eyes: Negative for discharge and redness.  Respiratory: Negative for cough, chest tightness and shortness of breath.   Cardiovascular: Negative for chest pain.  Gastrointestinal: Positive for abdominal pain and nausea. Negative for diarrhea and vomiting.  Musculoskeletal: Negative for myalgias.  Skin: Negative  for rash.  Neurological: Negative for dizziness, light-headedness and headaches.     Physical Exam Triage Vital Signs ED Triage Vitals  Enc Vitals Group     BP 08/28/17 1307 (!) 142/87     Pulse Rate 08/28/17 1307 69     Resp 08/28/17 1307 16     Temp 08/28/17 1307 98.5 F (36.9 C)     Temp Source 08/28/17 1307 Oral     SpO2 08/28/17 1307 99 %     Weight --      Height --      Head Circumference --      Peak Flow --      Pain Score 08/28/17 1308 7     Pain Loc --      Pain Edu? --      Excl. in GC? --    No data found.  Updated Vital Signs BP (!) 142/87 (BP Location: Right Arm)   Pulse 69   Temp 98.5 F  (36.9 C) (Oral)   Resp 16   SpO2 99%   Visual Acuity Right Eye Distance:   Left Eye Distance:   Bilateral Distance:    Right Eye Near:   Left Eye Near:    Bilateral Near:     Physical Exam  Constitutional: He is oriented to person, place, and time. He appears well-developed and well-nourished.  HENT:  Head: Normocephalic and atraumatic.  Mouth/Throat: Oropharynx is clear and moist.  Eyes: Conjunctivae are normal.  Neck: Neck supple.  Cardiovascular: Normal rate and regular rhythm.  No murmur heard. Pulmonary/Chest: Effort normal and breath sounds normal. No respiratory distress.  Abdominal: Soft. There is tenderness.  Tenderness to bilateral lower quadrants as well as epigastric area, negative rebound, negative Rovsing, negative McBurney's, negative Murphy's.  Musculoskeletal: He exhibits no edema.  Patient ambulates from chair to exam table without abnormality does not appear to be in discomfort  Neurological: He is alert and oriented to person, place, and time.  Skin: Skin is warm and dry.  Psychiatric: He has a normal mood and affect.  Nursing note and vitals reviewed.    UC Treatments / Results  Labs (all labs ordered are listed, but only abnormal results are displayed) Labs Reviewed - No data to display  EKG None  Radiology No results found.  Procedures Procedures (including critical care time)  Medications Ordered in UC Medications  gi cocktail (Maalox,Lidocaine,Donnatal) (30 mLs Oral Given 08/28/17 1339)    Initial Impression / Assessment and Plan / UC Course  I have reviewed the triage vital signs and the nursing notes.  Pertinent labs & imaging results that were available during my care of the patient were reviewed by me and considered in my medical decision making (see chart for details).     Patient with epigastric pain and nausea.  EKG normal sinus rhythm, only change from previous EKG in 2014 this T wave inversion in lead V1.  Otherwise no  signs of ischemia or infarction. EKG reviewed by Dr. Delton See.  GI cocktail provided, provided mild relief of abdominal pain.  Patient likely has some gastritis related to recent alcohol/new food use as well as some constipation.  Will provide 2 weeks of reflux medicine, as well as discussed recommendations for constipation.  Will use Zofran as needed for nausea in case underlying viral etiology.  Vital signs stable, patient does not appear dehydrated.Discussed strict return precautions. Patient verbalized understanding and is agreeable with plan.  Final Clinical Impressions(s) / UC Diagnoses  Final diagnoses:  Epigastric pain     Discharge Instructions     Please take a reflux medicine daily for 2 weeks, you may take ranitidine/Zantac 150 mg twice daily or omeprazole/Prilosec 20 mg daily you may get these over-the-counter, but I will go ahead and send in Prilosec for you.  Please use Zofran as needed for nausea and vomiting every 8 hours, this dissolves underneath your tongue.  Constipation: Please use Miralax for moderate to severe constipation. Take this once a day for the next 2-3 days. Please also start docusate stool softener, twice a day for at least 1 week. If stools become loose, cut down to once a day for another week. If stools remain loose, cut back to 1 pill every other day for a third week. You can stop docusate thereafter and resume as needed for constipation.  To help reduce constipation and promote bowel health: 1. Drink at least 64 ounces of water each day 2. Eat plenty of fiber (fruits, vegetables, whole grains, legumes) 3. Be physically active or exercise including walking, jogging, swimming, yoga, etc. 4. For active constipation use a stool softener (docusate) or an osmotic laxative (like Miralax) each day, or as needed.    ED Prescriptions    Medication Sig Dispense Auth. Provider   omeprazole (PRILOSEC) 20 MG capsule Take 1 capsule (20 mg total) by mouth daily for 14  days. 14 capsule Wieters, Hallie C, PA-C   ondansetron (ZOFRAN ODT) 4 MG disintegrating tablet Take 1 tablet (4 mg total) by mouth every 8 (eight) hours as needed for nausea or vomiting. 20 tablet Wieters, Wheaton C, PA-C     Controlled Substance Prescriptions [place] Controlled Substance Registry consulted? Not Applicable   Lew Dawes, [place] 08/28/17 1358

## 2017-08-28 NOTE — Discharge Instructions (Signed)
Please take a reflux medicine daily for 2 weeks, you may take ranitidine/Zantac 150 mg twice daily or omeprazole/Prilosec 20 mg daily you may get these over-the-counter, but I will go ahead and send in Prilosec for you.  Please use Zofran as needed for nausea and vomiting every 8 hours, this dissolves underneath your tongue.  Constipation: Please use Miralax for moderate to severe constipation. Take this once a day for the next 2-3 days. Please also start docusate stool softener, twice a day for at least 1 week. If stools become loose, cut down to once a day for another week. If stools remain loose, cut back to 1 pill every other day for a third week. You can stop docusate thereafter and resume as needed for constipation.  To help reduce constipation and promote bowel health: 1. Drink at least 64 ounces of water each day 2. Eat plenty of fiber (fruits, vegetables, whole grains, legumes) 3. Be physically active or exercise including walking, jogging, swimming, yoga, etc. 4. For active constipation use a stool softener (docusate) or an osmotic laxative (like Miralax) each day, or as needed.

## 2017-08-28 NOTE — ED Triage Notes (Signed)
C/o abd. Pain onset 3-4 days ago c/o nausea no vomitng

## 2017-09-05 ENCOUNTER — Other Ambulatory Visit: Payer: Self-pay

## 2017-09-05 ENCOUNTER — Emergency Department (HOSPITAL_COMMUNITY)
Admission: EM | Admit: 2017-09-05 | Discharge: 2017-09-06 | Disposition: A | Discharge status: Home / Self Care | Payer: Self-pay | Attending: Emergency Medicine | Admitting: Emergency Medicine

## 2017-09-05 ENCOUNTER — Encounter (HOSPITAL_COMMUNITY): Payer: Self-pay | Admitting: *Deleted

## 2017-09-05 ENCOUNTER — Emergency Department (HOSPITAL_COMMUNITY): Admission: EM | Admit: 2017-09-05 | Discharge: 2017-09-05 | Discharge status: Absent without leave | Payer: Self-pay

## 2017-09-05 DIAGNOSIS — Z87891 Personal history of nicotine dependence: Secondary | ICD-10-CM | POA: Insufficient documentation

## 2017-09-05 DIAGNOSIS — L02818 Cutaneous abscess of other sites: Secondary | ICD-10-CM | POA: Insufficient documentation

## 2017-09-05 DIAGNOSIS — Z79899 Other long term (current) drug therapy: Secondary | ICD-10-CM | POA: Insufficient documentation

## 2017-09-05 DIAGNOSIS — L0291 Cutaneous abscess, unspecified: Secondary | ICD-10-CM

## 2017-09-05 NOTE — ED Triage Notes (Signed)
Pt reports having a "pimple" in his left ear.  Pt reports popping the pimple earlier today and fluid came out.  Pt also reports a headache.

## 2017-09-06 MED ORDER — MUPIROCIN CALCIUM 2 % EX CREA: 1.0000 "application " | TOPICAL_CREAM | Freq: Two times a day (BID) | CUTANEOUS | 0 refills | Status: DC

## 2017-09-06 NOTE — ED Provider Notes (Signed)
[facility]   CSN: [id number] Arrival date & time: 09/05/17  2224     History   Chief Complaint No chief complaint on file.   HPI Jason Vargas is a 32 y.o. male with a hx of no major medical problems presents to the Emergency Department complaining of gradual, persistent, progressively worsening "pimple" in his right ear onset 3 days ago.  Pt reports he noticed a "whote spot" on the pimple and used a broken q-tip to open the site.  He reports a small amount of white fluid came out when he did this, but the swelling seems to be getting worse tonight.  Pt reports pain at the site that is worse with palpation.  He reports that when he pushes on the site it makes his ear and the left side of his head hurt.  He denies headache without palpation.  Pt denies hx of immunocompromise, diabetes or HIV.  He denies decreased hearing, changes in vision.  Palpation makes his symptoms worse.  Nothing seems to make them better.  He denies fever, chills, headache, neck pain, neck stiffness.   The history is provided by the patient and medical records. No language interpreter was used.    Past Medical History:  Diagnosis Date  . Chest pain   . Hx of echocardiogram    Echo 7/14: EF 60-65%, normal WM  . Reflux     Patient Active Problem List   Diagnosis Date Noted  . Chest pain, unspecified 10/04/2012    History reviewed. No pertinent surgical history.      Home Medications    Prior to Admission medications   Medication Sig Start Date End Date Taking? Authorizing Provider  cyclobenzaprine (FLEXERIL) 10 MG tablet Take 1 tablet (10 mg total) by mouth at bedtime. 08/07/17   Wurst, [place], PA-C  fish oil-omega-3 fatty acids 1000 MG capsule Take 1 g by mouth daily as needed (Takes only when he feels chest pain-2-3 times per week).    [provider]  ibuprofen (ADVIL,MOTRIN) 200 MG tablet Take 200 mg by mouth every 6 (six) hours as needed.     [provider]  mupirocin cream (BACTROBAN) 2 % Apply 1 application topically 2 (two) times daily. 09/06/17   Rosine Solecki, Dahlia ClientHannah, PA-C  naproxen (NAPROSYN) 500 MG tablet Take 1 tablet (500 mg total) by mouth 2 (two) times daily. 08/07/17   Wurst, [place], PA-C  omeprazole (PRILOSEC) 20 MG capsule Take 1 capsule (20 mg total) by mouth daily for 14 days. 08/28/17 09/11/17  Wieters, Hallie C, PA-C  ondansetron (ZOFRAN ODT) 4 MG disintegrating tablet Take 1 tablet (4 mg total) by mouth every 8 (eight) hours as needed for nausea or vomiting. 08/28/17   Wieters, Junius CreamerHallie C, PA-C    Family History Family History  Problem Relation Age of Onset  . Healthy Mother   . Healthy Father   . Heart attack Neg Hx   . Sudden death Neg Hx     Social History Social History   Tobacco Use  . Smoking status: Former Games developermoker  . Smokeless tobacco: Never Used  Substance Use Topics  . Alcohol use: Yes    Comment: Occassional   . Drug use: No     Allergies   Patient has no known allergies.   Review of Systems Review of Systems  Constitutional: Negative for chills and fever.  HENT: Positive for ear pain. Negative for facial swelling.   Eyes: Negative for visual disturbance.  Respiratory: Negative for chest tightness.   Cardiovascular: Negative for chest pain.  Gastrointestinal: Negative for abdominal pain, nausea and vomiting.  Skin: Negative for rash.       pimple  Allergic/Immunologic: Negative for immunocompromised state.  Neurological: Positive for headaches (only with palpation of the site).  Hematological: Negative for adenopathy.  Psychiatric/Behavioral: The patient is not nervous/anxious.      Physical Exam Updated Vital Signs BP 128/87   Pulse 74   Temp 98.4 F (36.9 C) (Oral)   Resp 18   Ht 5\' 10"  (1.778 m)   Wt 100.2 kg (220 lb 14.4 oz)   SpO2 100%   BMI 31.70 kg/m   Physical Exam  Constitutional: He appears well-developed and well-nourished. No distress.  HENT:    Head: Normocephalic and atraumatic.  Right Ear: Hearing, tympanic membrane, external ear and ear canal normal.  Left Ear: Hearing, tympanic membrane and ear canal normal.  Ears:  No swelling of the tragus or any tissue around the ear or side of the head.  No rash.  No vesicular lesions.  Eyes: Conjunctivae are normal. No scleral icterus.  Neck: Normal range of motion.  Cardiovascular: Normal rate and intact distal pulses.  Pulmonary/Chest: Effort normal.  Abdominal: He exhibits no distension.  Musculoskeletal: Normal range of motion.  Neurological: He is alert.  Skin: Skin is warm and dry.  Psychiatric: He has a normal mood and affect.  Nursing note and vitals reviewed.    ED Treatments / Results   Procedures Procedures (including critical care time)  Medications Ordered in ED Medications - No data to display   Initial Impression / Assessment and Plan / ED Course  I have reviewed the triage vital signs and the nursing notes.  Pertinent labs & imaging results that were available during my care of the patient were reviewed by me and considered in my medical decision making (see chart for details).     Patient presents with concerns of abscess in his left ear.  He has a very small pimple just behind the tragus.  There is no extending area of erythema, induration to suggest cellulitis.  No evidence of otitis media or otitis externa.  No vesicular lesions to suggest herpes or shingles.  No vision changes.  What patient reports is a headache is more localized pain with palpation.  Patient will be given Bactroban and warm compresses for site are recommended.  Discussed importance of not continuing to attempt to open the site.  Patient states understanding and is in agreement with the plan.  Discussed reasons to return immediately to the emergency department.  Final Clinical Impressions(s) / ED Diagnoses   Final diagnoses:  Abscess    ED Discharge Orders        Ordered     mupirocin cream (BACTROBAN) 2 %  2 times daily     09/06/17 0134       Amear Strojny, Dahlia Client, PA-C 09/06/17 0143    Melene Plan, DO 09/06/17 1610

## 2017-09-06 NOTE — Discharge Instructions (Addendum)
1. Medications: bactroban - apply to the pimple in your left ear, usual home medications 2. Treatment: rest, drink plenty of fluids, warm compresses; do not attempt to open the site with any sharp objects 3. Follow Up: Please followup with your primary doctor in 2-3 days for discussion of your diagnoses and further evaluation after today's visit; if you do not have a primary care doctor use the resource guide provided to find one; Please return to the ER for fevers, worsening swelling, hearing loss or other concerns

## 2017-12-23 ENCOUNTER — Emergency Department (HOSPITAL_COMMUNITY)
Admission: EM | Admit: 2017-12-23 | Discharge: 2017-12-24 | Disposition: A | Discharge status: Home / Self Care | Payer: Self-pay | Attending: Emergency Medicine | Admitting: Emergency Medicine

## 2017-12-23 ENCOUNTER — Other Ambulatory Visit: Payer: Self-pay

## 2017-12-23 ENCOUNTER — Encounter (HOSPITAL_COMMUNITY): Payer: Self-pay | Admitting: Emergency Medicine

## 2017-12-23 DIAGNOSIS — L210 Seborrhea capitis: Secondary | ICD-10-CM

## 2017-12-23 DIAGNOSIS — L42 Pityriasis rosea: Secondary | ICD-10-CM | POA: Insufficient documentation

## 2017-12-23 DIAGNOSIS — Y9366 Activity, soccer: Secondary | ICD-10-CM | POA: Insufficient documentation

## 2017-12-23 DIAGNOSIS — W500XXD Accidental hit or strike by another person, subsequent encounter: Secondary | ICD-10-CM | POA: Insufficient documentation

## 2017-12-23 DIAGNOSIS — S0093XS Contusion of unspecified part of head, sequela: Secondary | ICD-10-CM | POA: Insufficient documentation

## 2017-12-23 DIAGNOSIS — Y999 Unspecified external cause status: Secondary | ICD-10-CM | POA: Insufficient documentation

## 2017-12-23 DIAGNOSIS — Y9289 Other specified places as the place of occurrence of the external cause: Secondary | ICD-10-CM | POA: Insufficient documentation

## 2017-12-23 DIAGNOSIS — Z87891 Personal history of nicotine dependence: Secondary | ICD-10-CM | POA: Insufficient documentation

## 2017-12-23 DIAGNOSIS — Z79899 Other long term (current) drug therapy: Secondary | ICD-10-CM | POA: Insufficient documentation

## 2017-12-23 DIAGNOSIS — S0083XA Contusion of other part of head, initial encounter: Secondary | ICD-10-CM

## 2017-12-23 NOTE — ED Triage Notes (Signed)
Patient c/o elbow to nose on Monday while playing soccer. Denies LOC.

## 2017-12-24 ENCOUNTER — Other Ambulatory Visit: Payer: Self-pay

## 2017-12-24 NOTE — Discharge Instructions (Signed)
Take ibuprofen 600 mg (3 tablets) every 6 hours for inflammation. Apply cool compresses to the nose to reduce/resolve swelling. You can follow up with [facility] for any worries about nose deformity.

## 2017-12-24 NOTE — ED Provider Notes (Signed)
[facility]-EMERGENCY DEPT Provider Note   CSN: [id number] Arrival date & time: 12/23/17  2220     History   Chief Complaint Chief Complaint  Patient presents with  . Facial Injury    HPI Jason Vargas is a 32 y.o. male.  Patient to ED with concern for deformity to his nose after an injury 3 days ago. He was playing soccer and another player hit his face with his elbow. No LOC at the time. No nausea or vomiting since the injury. He reports he feels he cannot breath through the right nostril like usual. He initially had pain to the bridge of the nose that is improving over time, and he feels he is less swollen, but he is very concerned that the nose appears abnormal and is asking for treatment of this. He is also asking about a rash that has developed on his back over several weeks that is sometimes itchy. He has been using his eczema cream on the area but it has not resolved.   The history is provided by the patient. No language interpreter was used.    Past Medical History:  Diagnosis Date  . Chest pain   . Hx of echocardiogram    Echo 7/14: EF 60-65%, normal WM  . Reflux     Patient Active Problem List   Diagnosis Date Noted  . Chest pain, unspecified 10/04/2012    History reviewed. No pertinent surgical history.      Home Medications    Prior to Admission medications   Medication Sig Start Date End Date Taking? Authorizing Provider  cyclobenzaprine (FLEXERIL) 10 MG tablet Take 1 tablet (10 mg total) by mouth at bedtime. 08/07/17   Wurst, [place], PA-C  fish oil-omega-3 fatty acids 1000 MG capsule Take 1 g by mouth daily as needed (Takes only when he feels chest pain-2-3 times per week).    [provider]  ibuprofen (ADVIL,MOTRIN) 200 MG tablet Take 200 mg by mouth every 6 (six) hours as needed.    [provider]  mupirocin cream (BACTROBAN) 2 % Apply 1 application topically 2 (two) times daily. 09/06/17   Muthersbaugh, Dahlia Client,  PA-C  naproxen (NAPROSYN) 500 MG tablet Take 1 tablet (500 mg total) by mouth 2 (two) times daily. 08/07/17   Wurst, [place], PA-C  omeprazole (PRILOSEC) 20 MG capsule Take 1 capsule (20 mg total) by mouth daily for 14 days. 08/28/17 09/11/17  Wieters, Hallie C, PA-C  ondansetron (ZOFRAN ODT) 4 MG disintegrating tablet Take 1 tablet (4 mg total) by mouth every 8 (eight) hours as needed for nausea or vomiting. 08/28/17   Wieters, Junius Creamer, PA-C    Family History Family History  Problem Relation Age of Onset  . Healthy Mother   . Healthy Father   . Heart attack Neg Hx   . Sudden death Neg Hx     Social History Social History   Tobacco Use  . Smoking status: Former Games developer  . Smokeless tobacco: Never Used  Substance Use Topics  . Alcohol use: Yes    Comment: Occassional   . Drug use: No     Allergies   Patient has no known allergies.   Review of Systems Review of Systems  HENT: Positive for facial swelling (See HPI.). Negative for congestion, nosebleeds and trouble swallowing.   Eyes: Negative for pain.  Respiratory: Negative for shortness of breath.   Gastrointestinal: Negative for nausea.  Musculoskeletal: Negative for neck pain.  Skin: Positive for rash.  Physical Exam Updated Vital Signs BP (!) 138/92 (BP Location: Left Arm)   Pulse 75   Temp 98.8 F (37.1 C) (Oral)   Resp 18   SpO2 99%   Physical Exam  Constitutional: He is oriented to person, place, and time. He appears well-developed and well-nourished.  HENT:  Minimal swelling on the right lateral nose. No bony deformity. Nostrils patent bilaterally. There is right nostril mucosal swelling without visualized septal hematoma. No tenderness of facial bones including nose.   Neck: Normal range of motion.  Pulmonary/Chest: Effort normal.  Musculoskeletal: Normal range of motion.  Neurological: He is alert and oriented to person, place, and time.  Skin: Skin is warm and dry.  Scattered, non-raised,  hypopigmented rash to mid-back bilaterally. C/w pityriasis.  Psychiatric: He has a normal mood and affect.     ED Treatments / Results  Labs (all labs ordered are listed, but only abnormal results are displayed) Labs Reviewed - No data to display  EKG None  Radiology No results found.  Procedures Procedures (including critical care time)  Medications Ordered in ED Medications - No data to display   Initial Impression / Assessment and Plan / ED Course  I have reviewed the triage vital signs and the nursing notes.  Pertinent labs & imaging results that were available during my care of the patient were reviewed by me and considered in my medical decision making (see chart for details).     Patient here for concern for nasal deformity after injury 3 days ago. Pain has resolved but he feels there is a deformity. Discussed imaging and that I would not recommend x-rays at this time. Will provide a referral to plastic surgery at patient's request.   Rash on his back is c/w pityriasis. Information on condition provided to the patient.   Final Clinical Impressions(s) / ED Diagnoses   Final diagnoses:  None   1. Facial contusion 2. Pityriasis   ED Discharge Orders    None       Elpidio Anis, PA-C 12/24/17 0054    Shon Baton, MD 12/24/17 [phone]

## 2018-02-13 ENCOUNTER — Encounter (HOSPITAL_COMMUNITY): Payer: Self-pay | Admitting: Emergency Medicine

## 2018-02-13 ENCOUNTER — Ambulatory Visit (HOSPITAL_COMMUNITY)
Admission: EM | Admit: 2018-02-13 | Discharge: 2018-02-13 | Disposition: A | Discharge status: Home / Self Care | Payer: Self-pay | Attending: Emergency Medicine | Admitting: Emergency Medicine

## 2018-02-13 DIAGNOSIS — R300 Dysuria: Secondary | ICD-10-CM | POA: Insufficient documentation

## 2018-02-13 DIAGNOSIS — Z87891 Personal history of nicotine dependence: Secondary | ICD-10-CM | POA: Insufficient documentation

## 2018-02-13 LAB — POCT URINALYSIS DIP (DEVICE)
Bilirubin Urine: NEGATIVE
GLUCOSE, UA: NEGATIVE mg/dL
Ketones, ur: NEGATIVE mg/dL
Leukocytes, UA: NEGATIVE
Nitrite: NEGATIVE
Protein, ur: NEGATIVE mg/dL
Specific Gravity, Urine: >=1.03 (ref 1.005–1.030)
UROBILINOGEN UA: 0.2 mg/dL (ref 0.0–1.0)
pH: 6.5 (ref 5.0–8.0)

## 2018-02-13 MED ORDER — PHENAZOPYRIDINE HCL 200 MG PO TABS: 200.0000 mg | ORAL_TABLET | Freq: Three times a day (TID) | ORAL | 0 refills | Status: DC

## 2018-02-13 NOTE — ED Provider Notes (Signed)
[facility]    CSN: [id number] Arrival date & time: 02/13/18  1638     History   Chief Complaint Chief Complaint  Patient presents with  . Dysuria    HPI Jason Vargas is a 32 y.o. male no significant past medical history presenting today for evaluation of dysuria.  Patient states that he is noticed some burning and discomfort with urination that began today.  He had a similar episode approximately 3 weeks ago.  Symptoms resolved with drinking plenty of fluids.  He is concerned that he received an infection from using a public toilet as he works at [employer].  He denies any penile discharge.  Denies any rashes or lesions.  Patient is sexually active, but denies any new partners or concerns for STDs.  He has also had increased frequency of urination.  He does note that he recently changed soaps to treat a fungal infection on his back.  HPI  Past Medical History:  Diagnosis Date  . Chest pain   . Hx of echocardiogram    Echo 7/14: EF 60-65%, normal WM  . Reflux     Patient Active Problem List   Diagnosis Date Noted  . Chest pain, unspecified 10/04/2012    History reviewed. No pertinent surgical history.     Home Medications    Prior to Admission medications   Medication Sig Start Date End Date Taking? Authorizing Provider  fish oil-omega-3 fatty acids 1000 MG capsule Take 1 g by mouth daily as needed (Takes only when he feels chest pain-2-3 times per week).    [provider]  mupirocin cream (BACTROBAN) 2 % Apply 1 application topically 2 (two) times daily. 09/06/17   Muthersbaugh, Dahlia Client, PA-C  phenazopyridine (PYRIDIUM) 200 MG tablet Take 1 tablet (200 mg total) by mouth 3 (three) times daily. 02/13/18   Jeramiah Mccaughey, Junius Creamer, PA-C    Family History Family History  Problem Relation Age of Onset  . Healthy Mother   . Healthy Father   . Heart attack Neg Hx   . Sudden death Neg Hx     Social History Social History   Tobacco Use  . Smoking  status: Former Games developer  . Smokeless tobacco: Never Used  Substance Use Topics  . Alcohol use: Yes    Comment: Occassional   . Drug use: No     Allergies   Patient has no known allergies.   Review of Systems Review of Systems  Constitutional: Negative for fever.  HENT: Negative for sore throat.   Respiratory: Negative for shortness of breath.   Cardiovascular: Negative for chest pain.  Gastrointestinal: Negative for abdominal pain, nausea and vomiting.  Genitourinary: Positive for dysuria and frequency. Negative for difficulty urinating, discharge, penile pain, penile swelling, scrotal swelling and testicular pain.  Skin: Negative for rash.  Neurological: Negative for dizziness, light-headedness and headaches.     Physical Exam Triage Vital Signs ED Triage Vitals [02/13/18 1743]  Enc Vitals Group     BP 134/84     Pulse Rate 61     Resp 16     Temp 98.6 F (37 C)     Temp src      SpO2 100 %     Weight      Height      Head Circumference      Peak Flow      Pain Score 7     Pain Loc      Pain Edu?  Excl. in GC?    No data found.  Updated Vital Signs BP 134/84   Pulse 61   Temp 98.6 F (37 C)   Resp 16   SpO2 100%   Visual Acuity Right Eye Distance:   Left Eye Distance:   Bilateral Distance:    Right Eye Near:   Left Eye Near:    Bilateral Near:     Physical Exam Vitals signs and nursing note reviewed.  Constitutional:      Appearance: He is well-developed.     Comments: No acute distress  HENT:     Head: Normocephalic and atraumatic.     Nose: Nose normal.  Eyes:     Conjunctiva/sclera: Conjunctivae normal.  Neck:     Musculoskeletal: Neck supple.  Cardiovascular:     Rate and Rhythm: Normal rate.  Pulmonary:     Effort: Pulmonary effort is normal. No respiratory distress.  Abdominal:     General: Abdomen is flat. There is no distension.     Tenderness: There is no abdominal tenderness.     Comments: Nontender light palpation  throughout abdomen, no focal tenderness  Musculoskeletal: Normal range of motion.  Skin:    General: Skin is warm and dry.  Neurological:     Mental Status: He is alert and oriented to person, place, and time.      UC Treatments / Results  Labs (all labs ordered are listed, but only abnormal results are displayed) Labs Reviewed  POCT URINALYSIS DIP (DEVICE) - Abnormal; Notable for the following components:      Result Value   Hgb urine dipstick MODERATE (*)    All other components within normal limits  URINE CULTURE  URINE CYTOLOGY ANCILLARY ONLY    EKG None  Radiology No results found.  Procedures Procedures (including critical care time)  Medications Ordered in UC Medications - No data to display  Initial Impression / Assessment and Plan / UC Course  I have reviewed the triage vital signs and the nursing notes.  Pertinent labs & imaging results that were available during my care of the patient were reviewed by me and considered in my medical decision making (see chart for details).     UA negative for leuks and nitrites, moderate hemoglobin.  Do not suspect underlying kidney stone.  Will send off urine cytology to check for STDs as cause of this.  Patient is confident that these will return negative as well, will hold off on empiric treatment.  Will provide Pyridium to use in the meantime for dysuria.  Drink plenty fluids.  Return to normal soaps.Discussed strict return precautions. Patient verbalized understanding and is agreeable with plan.  Final Clinical Impressions(s) / UC Diagnoses   Final diagnoses:  Dysuria     Discharge Instructions     Your urine did not show any signs of infection today, we will send this off to also check for STDs as other causes of discomfort with urination You may use Pyridium as needed to help with burning sensation Please drink plenty of fluids to get urine to be clear to light yellow Please limit use of scented soaps to areas  as this can also cause irritation to the area  Please follow-up if burning not resolving with recommendations We will call you with the results of this urine if results come back positive   ED Prescriptions    Medication Sig Dispense Auth. Provider   phenazopyridine (PYRIDIUM) 200 MG tablet Take 1 tablet (200 mg total)  by mouth 3 (three) times daily. 6 tablet Bjorn Hallas, [place] C, PA-C     Controlled Substance Prescriptions [place] Controlled Substance Registry consulted? Not Applicable   Lew Dawes, [place] 02/13/18 1911

## 2018-02-13 NOTE — ED Triage Notes (Signed)
Pt states he thinks he used a public toilet and "got a toilet infection" pt states it burns when he pees. Pt is sexually active.

## 2018-02-13 NOTE — Discharge Instructions (Addendum)
Your urine did not show any signs of infection today, we will send this off to also check for STDs as other causes of discomfort with urination You may use Pyridium as needed to help with burning sensation Please drink plenty of fluids to get urine to be clear to light yellow Please limit use of scented soaps to areas as this can also cause irritation to the area  Please follow-up if burning not resolving with recommendations We will call you with the results of this urine if results come back positive

## 2018-02-15 LAB — URINE CYTOLOGY ANCILLARY ONLY
Chlamydia: NEGATIVE
Neisseria Gonorrhea: NEGATIVE
Trichomonas: NEGATIVE

## 2018-03-17 DIAGNOSIS — Z0000 Encounter for general adult medical examination without abnormal findings: Secondary | ICD-10-CM | POA: Diagnosis not present

## 2018-04-08 DIAGNOSIS — Z23 Encounter for immunization: Secondary | ICD-10-CM | POA: Diagnosis not present

## 2018-08-24 ENCOUNTER — Other Ambulatory Visit: Payer: Self-pay

## 2018-08-24 ENCOUNTER — Encounter (HOSPITAL_COMMUNITY): Payer: Self-pay

## 2018-08-24 ENCOUNTER — Ambulatory Visit (HOSPITAL_COMMUNITY)
Admission: EM | Admit: 2018-08-24 | Discharge: 2018-08-24 | Disposition: A | Discharge status: Home / Self Care | Payer: Self-pay | Attending: Family Medicine | Admitting: Family Medicine

## 2018-08-24 DIAGNOSIS — M25561 Pain in right knee: Secondary | ICD-10-CM

## 2018-08-24 DIAGNOSIS — Y9302 Activity, running: Secondary | ICD-10-CM

## 2018-08-24 DIAGNOSIS — M791 Myalgia, unspecified site: Secondary | ICD-10-CM

## 2018-08-24 MED ORDER — MELOXICAM 7.5 MG PO TABS: 15.0000 mg | ORAL_TABLET | Freq: Every day | ORAL | 0 refills | Status: DC

## 2018-08-24 MED ORDER — METHOCARBAMOL 500 MG PO TABS: 500.0000 mg | ORAL_TABLET | Freq: Two times a day (BID) | ORAL | 0 refills | Status: DC

## 2018-08-24 NOTE — Discharge Instructions (Addendum)
Take the medication as prescribed. You should be able to get both of these prescriptions for under $15 Take the meloxicam for pain inflammation.  You can take up to 15 mg total by mouth daily. Robaxin for muscle relaxant. Use your support devices for running Rest, ice, elevate Follow up as needed for continued or worsening symptoms

## 2018-08-24 NOTE — ED Provider Notes (Signed)
[facility]    CSN: [id number] Arrival date & time: 08/24/18  1045     History   Chief Complaint Chief Complaint  Patient presents with  . Fall    HPI Jason Vargas is a 33 y.o. male.   Patient is a 33 year old male that presents today with joint pain.  This is a problem that comes and goes.  He has pain intermittently in the elbows, right knee and left ankle.  Contributing this to running.  He runs about 30 minutes to hour daily.  When he starts feeling the discomfort he will rest for a few days and the pain improves. No joint swelling or erythema.  [facility] [employer] and training pretty regularly.  He has also been doing a lot of push-ups and sometimes experiences chest soreness. No chest pain or SOB currently.  No fever, chills, cough or congestion.  No recent traveling or sick contacts.  No history of DVT or PE.  He has been using ankle brace which helps.  He also uses insoles in his shoes for support while running.   ROS per HPI      Past Medical History:  Diagnosis Date  . Chest pain   . Hx of echocardiogram    Echo 7/14: EF 60-65%, normal WM  . Reflux     Patient Active Problem List   Diagnosis Date Noted  . Chest pain, unspecified 10/04/2012    History reviewed. No pertinent surgical history.     Home Medications    Prior to Admission medications   Medication Sig Start Date End Date Taking? Authorizing Provider  fish oil-omega-3 fatty acids 1000 MG capsule Take 1 g by mouth daily as needed (Takes only when he feels chest pain-2-3 times per week).    [provider]  meloxicam (MOBIC) 7.5 MG tablet Take 2 tablets (15 mg total) by mouth daily. 08/24/18   Dahlia ByesBast, [place] Marian A, NP  methocarbamol (ROBAXIN) 500 MG tablet Take 1 tablet (500 mg total) by mouth 2 (two) times daily. 08/24/18   Janace ArisBast, Keilynn Marano A, NP    Family History Family History  Problem Relation Age of Onset  . Healthy Mother   . Healthy Father   . Heart attack Neg Hx   . Sudden  death Neg Hx     Social History Social History   Tobacco Use  . Smoking status: Former Games developermoker  . Smokeless tobacco: Never Used  Substance Use Topics  . Alcohol use: Yes    Comment: Occassional   . Drug use: No     Allergies   Patient has no known allergies.   Review of Systems Review of Systems   Physical Exam Triage Vital Signs ED Triage Vitals  Enc Vitals Group     BP 08/24/18 1121 140/80     Pulse Rate 08/24/18 1121 66     Resp 08/24/18 1121 18     Temp 08/24/18 1121 98.3 F (36.8 C)     Temp Source 08/24/18 1121 Oral     SpO2 08/24/18 1121 100 %     Weight 08/24/18 1122 208 lb (94.3 kg)     Height --      Head Circumference --      Peak Flow --      Pain Score 08/24/18 1122 5     Pain Loc --      Pain Edu? --      Excl. in GC? --    No data found.  Updated Vital Signs BP 140/80 (BP Location: Right Arm)   Pulse 66   Temp 98.3 F (36.8 C) (Oral)   Resp 18   Wt 208 lb (94.3 kg)   SpO2 100%   BMI 29.84 kg/m   Visual Acuity Right Eye Distance:   Left Eye Distance:   Bilateral Distance:    Right Eye Near:   Left Eye Near:    Bilateral Near:     Physical Exam Vitals signs and nursing note reviewed.  Constitutional:      General: He is not in acute distress.    Appearance: Normal appearance. He is well-developed. He is not ill-appearing, toxic-appearing or diaphoretic.  HENT:     Head: Normocephalic and atraumatic.     Nose: Nose normal.  Eyes:     Conjunctiva/sclera: Conjunctivae normal.  Neck:     Musculoskeletal: Normal range of motion and neck supple.  Pulmonary:     Effort: Pulmonary effort is normal.  Musculoskeletal: Normal range of motion.        General: No swelling, tenderness, deformity or signs of injury.     Right lower leg: No edema.     Left lower leg: No edema.  Skin:    General: Skin is warm and dry.     Findings: No rash.  Neurological:     Mental Status: He is alert.  Psychiatric:        Mood and Affect: Mood  normal.      UC Treatments / Results  Labs (all labs ordered are listed, but only abnormal results are displayed) Labs Reviewed - No data to display  EKG None  Radiology No results found.  Procedures Procedures (including critical care time)  Medications Ordered in UC Medications - No data to display  Initial Impression / Assessment and Plan / UC Course  I have reviewed the triage vital signs and the nursing notes.  Pertinent labs & imaging results that were available during my care of the patient were reviewed by me and considered in my medical decision making (see chart for details).     Exam benign No palpable pain in the areas and no joint swelling, erythema, bruising or deformities. Range of motion normal Believe pt is having joint and muscle pain due to over use from exercise and running. Resting helps this.  Gave pt recommendation and things he can try to help.  Rest, Ice meloxicam for pain and inflammation Robaxin for muscle relaxant.  Stretching, heat, massage. Use support devices  Follow up as needed for continued or worsening symptoms    Final Clinical Impressions(s) / UC Diagnoses   Final diagnoses:  None     Discharge Instructions     Take the medication as prescribed. You should be able to get both of these prescriptions for under $15 Take the meloxicam for pain inflammation.  You can take up to 15 mg total by mouth daily. Robaxin for muscle relaxant. Use your support devices for running Rest, ice, elevate Follow up as needed for continued or worsening symptoms     ED Prescriptions    Medication Sig Dispense Auth. Provider   meloxicam (MOBIC) 7.5 MG tablet Take 2 tablets (15 mg total) by mouth daily. 30 tablet Finnlee Guarnieri A, NP   methocarbamol (ROBAXIN) 500 MG tablet Take 1 tablet (500 mg total) by mouth 2 (two) times daily. 20 tablet Loura Halt A, NP     Controlled Substance Prescriptions  Controlled Substance Registry consulted?  Not Applicable  Janace ArisBast, Joshual Terrio A, NP 08/24/18 1456

## 2018-08-24 NOTE — ED Triage Notes (Signed)
Pt states he runs a lot and 3 weeks ago he fell. Pt states he runs a lot 30 mins to a hour. Pt states he has pain in hie right knee, left forearm and both ankles.  Pt thinks it could just be muscle pains from running a lot.

## 2018-11-05 DIAGNOSIS — R6889 Other general symptoms and signs: Secondary | ICD-10-CM | POA: Diagnosis not present

## 2018-11-13 ENCOUNTER — Ambulatory Visit (HOSPITAL_COMMUNITY)
Admission: EM | Admit: 2018-11-13 | Discharge: 2018-11-13 | Disposition: A | Discharge status: Home / Self Care | Payer: BC Managed Care – PPO | Attending: Emergency Medicine | Admitting: Emergency Medicine

## 2018-11-13 ENCOUNTER — Other Ambulatory Visit: Payer: Self-pay

## 2018-11-13 ENCOUNTER — Encounter (HOSPITAL_COMMUNITY): Payer: Self-pay | Admitting: Emergency Medicine

## 2018-11-13 ENCOUNTER — Ambulatory Visit (HOSPITAL_COMMUNITY): Admission: EM | Admit: 2018-11-13 | Discharge: 2018-11-13 | Discharge status: Absent without leave | Payer: Self-pay

## 2018-11-13 DIAGNOSIS — R51 Headache: Secondary | ICD-10-CM | POA: Diagnosis not present

## 2018-11-13 DIAGNOSIS — R519 Headache, unspecified: Secondary | ICD-10-CM

## 2018-11-13 MED ORDER — IBUPROFEN 600 MG PO TABS: 600.0000 mg | ORAL_TABLET | Freq: Four times a day (QID) | ORAL | 0 refills | Status: DC | PRN

## 2018-11-13 NOTE — Discharge Instructions (Signed)
Take Tylenol or ibuprofen as needed for your discomfort.    Go to the emergency department or follow-up with your primary care provider if your symptoms persist.

## 2018-11-13 NOTE — ED Provider Notes (Signed)
[place]    CSN: [id number] Arrival date & time: 11/13/18  1445      History   Chief Complaint Chief Complaint  Patient presents with  . Fatigue  . Headache  . Chills    HPI Jason Vargas is a 33 y.o. male.   Patient presents with mild headache, feeling of fever, chills, and fatigue x 4 days.  He states he had a negative COVID test last week which was done at his school.  He is in college and participates in [employer].  He refuses COVID testing today.  He denies sore throat, shortness of breath, cough, abdominal pain, vomiting, diarrhea, or other symptoms.  The history is provided by the patient.    Past Medical History:  Diagnosis Date  . Chest pain   . Hx of echocardiogram    Echo 7/14: EF 60-65%, normal WM  . Reflux     Patient Active Problem List   Diagnosis Date Noted  . Chest pain, unspecified 10/04/2012    History reviewed. No pertinent surgical history.     Home Medications    Prior to Admission medications   Medication Sig Start Date End Date Taking? Authorizing Provider  fish oil-omega-3 fatty acids 1000 MG capsule Take 1 g by mouth daily as needed (Takes only when he feels chest pain-2-3 times per week).    [provider]  ibuprofen (ADVIL) 600 MG tablet Take 1 tablet (600 mg total) by mouth every 6 (six) hours as needed. 11/13/18   Sharion Balloon, NP  meloxicam (MOBIC) 7.5 MG tablet Take 2 tablets (15 mg total) by mouth daily. 08/24/18   Loura Halt A, NP  methocarbamol (ROBAXIN) 500 MG tablet Take 1 tablet (500 mg total) by mouth 2 (two) times daily. 08/24/18   Orvan July, NP    Family History Family History  Problem Relation Age of Onset  . Healthy Mother   . Healthy Father   . Heart attack Neg Hx   . Sudden death Neg Hx     Social History Social History   Tobacco Use  . Smoking status: Former Research scientist (life sciences)  . Smokeless tobacco: Never Used  Substance Use Topics  . Alcohol use: Yes    Comment: Occassional   . Drug use: No      Allergies   Patient has no known allergies.   Review of Systems Review of Systems  Constitutional: Negative for chills and fever.  HENT: Negative for congestion, ear pain, rhinorrhea and sore throat.   Eyes: Negative for pain and visual disturbance.  Respiratory: Negative for cough and shortness of breath.   Cardiovascular: Negative for chest pain and palpitations.  Gastrointestinal: Negative for abdominal pain, diarrhea and vomiting.  Genitourinary: Negative for dysuria and hematuria.  Musculoskeletal: Negative for arthralgias and back pain.  Skin: Negative for color change and rash.  Neurological: Positive for headaches. Negative for dizziness, tremors, seizures, syncope, facial asymmetry, speech difficulty, weakness, light-headedness and numbness.  All other systems reviewed and are negative.    Physical Exam Triage Vital Signs ED Triage Vitals  Enc Vitals Group     BP      Pulse      Resp      Temp      Temp src      SpO2      Weight      Height      Head Circumference      Peak Flow      Pain  Score      Pain Loc      Pain Edu?      Excl. in GC?    No data found.  Updated Vital Signs BP 133/89 (BP Location: Right Arm)   Pulse 62   Temp 98.5 F (36.9 C) (Oral)   Resp (!) 9   SpO2 100%   Visual Acuity Right Eye Distance:   Left Eye Distance:   Bilateral Distance:    Right Eye Near:   Left Eye Near:    Bilateral Near:     Physical Exam Vitals signs and nursing note reviewed.  Constitutional:      Appearance: He is well-developed.     Comments: Patient is rude and irritable during exam.  He states he is upset that he was offering a COVID test.   HENT:     Head: Normocephalic and atraumatic.     Right Ear: Tympanic membrane normal.     Left Ear: Tympanic membrane normal.     Nose: Nose normal.     Mouth/Throat:     Mouth: Mucous membranes are moist.     Pharynx: Oropharynx is clear.  Eyes:     Extraocular Movements: Extraocular movements  intact.     Conjunctiva/sclera: Conjunctivae normal.     Pupils: Pupils are equal, round, and reactive to light.  Neck:     Musculoskeletal: Neck supple.  Cardiovascular:     Rate and Rhythm: Normal rate and regular rhythm.     Heart sounds: No murmur.  Pulmonary:     Effort: Pulmonary effort is normal. No respiratory distress.     Breath sounds: Normal breath sounds.  Abdominal:     General: Bowel sounds are normal.     Palpations: Abdomen is soft.     Tenderness: There is no abdominal tenderness. There is no guarding or rebound.  Skin:    General: Skin is warm and dry.     Findings: No rash.  Neurological:     General: No focal deficit present.     Mental Status: He is alert and oriented to person, place, and time.     Cranial Nerves: No cranial nerve deficit.     Sensory: No sensory deficit.     Motor: No weakness.     Coordination: Coordination normal.     Gait: Gait normal.     Deep Tendon Reflexes: Reflexes normal.      UC Treatments / Results  Labs (all labs ordered are listed, but only abnormal results are displayed) Labs Reviewed - No data to display  EKG   Radiology No results found.  Procedures Procedures (including critical care time)  Medications Ordered in UC Medications - No data to display  Initial Impression / Assessment and Plan / UC Course  I have reviewed the triage vital signs and the nursing notes.  Pertinent labs & imaging results that were available during my care of the patient were reviewed by me and considered in my medical decision making (see chart for details).    Acute non-intractable headache.  Patient refuses COVID testing today.  Instructed patient to take Tylenol or ibuprofen as needed.  Instructed him to follow-up with his PCP or go to the emergency department if his symptoms persist or worsen.     Final Clinical Impressions(s) / UC Diagnoses   Final diagnoses:  Acute nonintractable headache, unspecified headache type      Discharge Instructions     Take Tylenol or ibuprofen as needed for  your discomfort.    Go to the emergency department or follow-up with your primary care provider if your symptoms persist.         ED Prescriptions    Medication Sig Dispense Auth. Provider   ibuprofen (ADVIL) 600 MG tablet Take 1 tablet (600 mg total) by mouth every 6 (six) hours as needed. 30 tablet Mickie Bailate, Quadir Muns H, NP     Controlled Substance Prescriptions [place] Controlled Substance Registry consulted? Not Applicable   Mickie Bailate, Rikki Trosper H, NP 11/13/18 [phone]

## 2018-11-13 NOTE — ED Triage Notes (Signed)
Pt complains of a slight headache, chills, feeling hot, and fatigue for the last 3-4 days.  Pt states he had a Covid Test last week that was negative.  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

## 2018-11-15 DIAGNOSIS — R51 Headache: Secondary | ICD-10-CM | POA: Diagnosis not present

## 2018-11-15 DIAGNOSIS — E860 Dehydration: Secondary | ICD-10-CM | POA: Diagnosis not present

## 2019-01-17 DIAGNOSIS — R072 Precordial pain: Secondary | ICD-10-CM | POA: Diagnosis not present

## 2019-04-01 ENCOUNTER — Ambulatory Visit (HOSPITAL_COMMUNITY)
Admission: EM | Admit: 2019-04-01 | Discharge: 2019-04-01 | Disposition: A | Discharge status: Home / Self Care | Payer: Self-pay | Attending: Emergency Medicine | Admitting: Emergency Medicine

## 2019-04-01 ENCOUNTER — Other Ambulatory Visit: Payer: Self-pay

## 2019-04-01 DIAGNOSIS — M62838 Other muscle spasm: Secondary | ICD-10-CM

## 2019-04-01 DIAGNOSIS — L309 Dermatitis, unspecified: Secondary | ICD-10-CM

## 2019-04-01 MED ORDER — CYCLOBENZAPRINE HCL 5 MG PO TABS: 5.0000 mg | ORAL_TABLET | Freq: Every day | ORAL | 0 refills | Status: DC

## 2019-04-01 MED ORDER — HYDROCORTISONE 1 % EX CREA: TOPICAL_CREAM | CUTANEOUS | 0 refills | Status: DC

## 2019-04-01 MED ORDER — IBUPROFEN 800 MG PO TABS: 800.0000 mg | ORAL_TABLET | Freq: Three times a day (TID) | ORAL | 0 refills | Status: DC

## 2019-04-01 NOTE — ED Triage Notes (Signed)
Pt complains of right neck pain that radiates into his shoulder.  He states the pain has since traveled to the anterior portion of his neck as well.  Pt also reports a small spot under his left eye that has been very itchy and painful at times.

## 2019-04-01 NOTE — ED Provider Notes (Signed)
[place]    CSN: [id number] Arrival date & time: 04/01/19  1121      History   Chief Complaint Chief Complaint  Patient presents with  . Neck Pain    right  . Skin Problem    below left eye    HPI Jason Vargas is a 34 y.o. male.   Jason Vargas presents with complaints of right neck pain as well as area under left eye which is itchy. Right neck pain which started three days ago. No known injury or trauma. Worse with turning. Today feels improved and ROM has improved. No numbness tingling or weakness to extremities. Took ibuprofen yesterday which did help. Also with complaints of patch of skin under left eye which he noted three days ago, started out slightly red, maybe has increased some in size. Itches. Minimal pain. Denies any previous similar. Has applied vaseline to it which does help some.     ROS per HPI, negative if not otherwise mentioned.      Past Medical History:  Diagnosis Date  . Chest pain   . Hx of echocardiogram    Echo 7/14: EF 60-65%, normal WM  . Reflux     Patient Active Problem List   Diagnosis Date Noted  . Chest pain, unspecified 10/04/2012    No past surgical history on file.     Home Medications    Prior to Admission medications   Medication Sig Start Date End Date Taking? Authorizing Provider  cyclobenzaprine (FLEXERIL) 5 MG tablet Take 1 tablet (5 mg total) by mouth at bedtime. 04/01/19   Zigmund Gottron, NP  fish oil-omega-3 fatty acids 1000 MG capsule Take 1 g by mouth daily as needed (Takes only when he feels chest pain-2-3 times per week).    [provider]  hydrocortisone cream 1 % Apply to affected area 2 times daily 04/01/19   Augusto Gamble B, NP  ibuprofen (ADVIL) 800 MG tablet Take 1 tablet (800 mg total) by mouth 3 (three) times daily. 04/01/19   Zigmund Gottron, NP  meloxicam (MOBIC) 7.5 MG tablet Take 2 tablets (15 mg total) by mouth daily. 08/24/18   Orvan July, NP    Family  History Family History  Problem Relation Age of Onset  . Healthy Mother   . Healthy Father   . Heart attack Neg Hx   . Sudden death Neg Hx     Social History Social History   Tobacco Use  . Smoking status: Former Research scientist (life sciences)  . Smokeless tobacco: Never Used  Substance Use Topics  . Alcohol use: Yes    Comment: Occassional   . Drug use: No     Allergies   Patient has no known allergies.   Review of Systems Review of Systems   Physical Exam Triage Vital Signs ED Triage Vitals  Enc Vitals Group     BP 04/01/19 1159 131/89     Pulse Rate 04/01/19 1159 63     Resp 04/01/19 1159 12     Temp 04/01/19 1159 98.3 F (36.8 C)     Temp Source 04/01/19 1159 Oral     SpO2 04/01/19 1159 100 %     Weight --      Height --      Head Circumference --      Peak Flow --      Pain Score 04/01/19 1200 7     Pain Loc --      Pain Edu? --  Excl. in GC? --    No data found.  Updated Vital Signs BP 131/89 (BP Location: Left Arm)   Pulse 63   Temp 98.3 F (36.8 C) (Oral)   Resp 12   SpO2 100%   Visual Acuity Right Eye Distance:   Left Eye Distance:   Bilateral Distance:    Right Eye Near:   Left Eye Near:    Bilateral Near:     Physical Exam Constitutional:      Appearance: He is well-developed.  Eyes:      Comments: Two approximately 81mm in diameter very slightly raised and circular dry patch/ flaky skin toned skin under left eye lid  Neck:      Comments: Very mild right posterior neck musculature tenderness on palpation; full ROM of neck without difficulty  Cardiovascular:     Rate and Rhythm: Normal rate.  Pulmonary:     Effort: Pulmonary effort is normal.  Musculoskeletal:     Cervical back: Muscular tenderness present. No pain with movement or spinous process tenderness. Normal range of motion.  Skin:    General: Skin is warm and dry.  Neurological:     Mental Status: He is alert and oriented to person, place, and time.      UC Treatments / Results   Labs (all labs ordered are listed, but only abnormal results are displayed) Labs Reviewed - No data to display  EKG   Radiology No results found.  Procedures Procedures (including critical care time)  Medications Ordered in UC Medications - No data to display  Initial Impression / Assessment and Plan / UC Course  I have reviewed the triage vital signs and the nursing notes.  Pertinent labs & imaging results that were available during my care of the patient were reviewed by me and considered in my medical decision making (see chart for details).     Muscle strain and/or spasm to right neck, improving. Pain management discussed. suspect eczema under left eyelid with hydrocortisone x7 days recommended. If symptoms worsen or do not improve in the next week to return to be seen or to follow up with PCP. Marland Kitchen Patient verbalized understanding and agreeable to plan.    Final Clinical Impressions(s) / UC Diagnoses   Final diagnoses:  Muscle spasms of neck  Eczema, unspecified type     Discharge Instructions     Use of cream to under eye, very thin amount, twice a day. Do not use for longer than 10 days, then stop using for 10 days.  Use of moisturizer or aquaphor, vaseline under eyes to promote hydration, avoid touching under eye.  Ibuprofen regularly to help with pain, light massage, heat, stretching.  Muscle relaxer at night as needed.  Please establish with a primary care provider for recheck if symptoms persist.    ED Prescriptions    Medication Sig Dispense Auth. Provider   cyclobenzaprine (FLEXERIL) 5 MG tablet Take 1 tablet (5 mg total) by mouth at bedtime. 15 tablet Linus Mako B, NP   hydrocortisone cream 1 % Apply to affected area 2 times daily 15 g Linus Mako B, NP   ibuprofen (ADVIL) 800 MG tablet Take 1 tablet (800 mg total) by mouth 3 (three) times daily. 21 tablet Georgetta Haber, NP     PDMP not reviewed this encounter.   Georgetta Haber, NP 04/01/19  1912

## 2019-04-01 NOTE — Discharge Instructions (Addendum)
Use of cream to under eye, very thin amount, twice a day. Do not use for longer than 10 days, then stop using for 10 days.  Use of moisturizer or aquaphor, vaseline under eyes to promote hydration, avoid touching under eye.  Ibuprofen regularly to help with pain, light massage, heat, stretching.  Muscle relaxer at night as needed.  Please establish with a primary care provider for recheck if symptoms persist.

## 2019-04-22 DIAGNOSIS — J029 Acute pharyngitis, unspecified: Secondary | ICD-10-CM | POA: Diagnosis not present

## 2019-04-22 DIAGNOSIS — Z20828 Contact with and (suspected) exposure to other viral communicable diseases: Secondary | ICD-10-CM | POA: Diagnosis not present

## 2019-04-26 ENCOUNTER — Other Ambulatory Visit: Payer: Self-pay

## 2019-04-26 ENCOUNTER — Encounter (HOSPITAL_COMMUNITY): Payer: Self-pay | Admitting: *Deleted

## 2019-04-26 ENCOUNTER — Emergency Department (HOSPITAL_COMMUNITY)
Admission: EM | Admit: 2019-04-26 | Discharge: 2019-04-26 | Disposition: A | Discharge status: Home / Self Care | Payer: BC Managed Care – PPO | Attending: Emergency Medicine | Admitting: Emergency Medicine

## 2019-04-26 DIAGNOSIS — R070 Pain in throat: Secondary | ICD-10-CM | POA: Diagnosis not present

## 2019-04-26 DIAGNOSIS — Z87891 Personal history of nicotine dependence: Secondary | ICD-10-CM | POA: Insufficient documentation

## 2019-04-26 DIAGNOSIS — J029 Acute pharyngitis, unspecified: Secondary | ICD-10-CM | POA: Diagnosis not present

## 2019-04-26 MED ORDER — DEXAMETHASONE SODIUM PHOSPHATE 10 MG/ML IJ SOLN
10.0000 mg | Freq: Once | INTRAMUSCULAR | Status: AC
  Administered 2019-04-26: 10 mg via INTRAMUSCULAR
  Filled 2019-04-26: qty 1

## 2019-04-26 MED ORDER — FLUTICASONE PROPIONATE 50 MCG/ACT NA SUSP: 2.0000 | Freq: Every day | NASAL | 0 refills | Status: DC

## 2019-04-26 NOTE — ED Triage Notes (Signed)
Pt states he was out in the rain last Thursday, noticed sore throat and feels like something is stuck in throat. Ibuprofen helps and is able to swallow pillows ok. Talking and hnadling secretions well in triage. Pt has had 2 neg Covid test in last few days

## 2019-04-26 NOTE — Discharge Instructions (Addendum)
General Viral Syndrome Care Instructions:  Your symptoms are likely consistent with a viral illness. Viruses do not require or respond to antibiotics. Treatment is symptomatic care and it is important to note that these symptoms may last for 7-14 days.   Hand washing: Wash your hands throughout the day, but especially before and after touching the face, using the restroom, sneezing, coughing, or touching surfaces that have been coughed or sneezed upon. Hydration: Symptoms of most illnesses will be intensified and complicated by dehydration. Dehydration can also extend the duration of symptoms. Drink plenty of fluids and get plenty of rest. You should be drinking at least half a liter of water an hour to stay hydrated. Electrolyte drinks (ex. Gatorade, Powerade, Pedialyte) are also encouraged. You should be drinking enough fluids to make your urine light yellow, almost clear. If this is not the case, you are not drinking enough water. Please note that some of the treatments indicated below will not be effective if you are not adequately hydrated. Diet: Please concentrate on hydration, however, you may introduce food slowly.  Start with a clear liquid diet, progressed to a full liquid diet, and then bland solids as you are able. Pain or fever: Ibuprofen, Naproxen, or acetaminophen (generic for Tylenol) for pain or fever.  Antiinflammatory medications: Take 600 mg of ibuprofen every 6 hours or 440 mg (over the counter dose) to 500 mg (prescription dose) of naproxen every 12 hours for the next 3 days. After this time, these medications may be used as needed for pain. Take these medications with food to avoid upset stomach. Choose only one of these medications, do not take them together. Acetaminophen (generic for Tylenol): Should you continue to have additional pain while taking the ibuprofen or naproxen, you may add in acetaminophen as needed. Your daily total maximum amount of acetaminophen from all sources  should be limited to 4000mg /day for persons without liver problems, or 2000mg /day for those with liver problems. Zyrtec or Claritin: May add these medication daily to control underlying symptoms of congestion, sneezing, and other signs of allergies.  These medications are available over-the-counter. Generics: Cetirizine (generic for Zyrtec) and loratadine (generic for Claritin). Fluticasone: Use fluticasone (generic for Flonase), as directed, for nasal and sinus congestion.  This medication is available over-the-counter. Congestion: Plain guaifenesin (generic for plain Mucinex) may help relieve congestion. Saline sinus rinses and saline nasal sprays may also help relieve congestion.  Sore throat: Warm liquids or Chloraseptic spray may help soothe a sore throat. Gargle twice a day with a salt water solution made from a half teaspoon of salt in a cup of warm water.  Follow up: Follow up with a primary care provider or the Ear, Nose, and Throat (ENT) specialist within the next two weeks should symptoms fail to resolve. Return: Return to the ED for significantly worsening symptoms, shortness of breath, persistent vomiting, large amounts of blood in stool, or any other major concerns.  For prescription assistance, may try using prescription discount sites or apps, such as goodrx.com  Your blood pressure today was noted to be higher than normal.  This should be rechecked by primary care provider.

## 2019-04-26 NOTE — ED Provider Notes (Signed)
[facility] DEPT Provider Note   CSN: [id number] Arrival date & time: 04/26/19  1312     History Chief Complaint  Patient presents with  . Sore Throat    Jason Vargas is a 34 y.o. male.  HPI      Jason Vargas is a 34 y.o. male, patient with no pertinent past medical history, presenting to the ED with throat pain for about the last 4 days. [facility], negative strep and Covid testing.  He was prescribed ibuprofen, which improved his pain from 9/10 to 4/10.  The pain is sharp.  It feels worse when he feels congestion running down his throat, but is not made worse with eating or drinking. He also notes some nasal congestion, especially initially. Denies fever/chills, mouth/throat swelling, drooling, N/V/D, cough, shortness of breath, abdominal pain, chest pain, or any other complaints.   Past Medical History:  Diagnosis Date  . Chest pain   . Hx of echocardiogram    Echo 7/14: EF 60-65%, normal WM  . Reflux     Patient Active Problem List   Diagnosis Date Noted  . Chest pain, unspecified 10/04/2012    History reviewed. No pertinent surgical history.     Family History  Problem Relation Age of Onset  . Healthy Mother   . Healthy Father   . Heart attack Neg Hx   . Sudden death Neg Hx     Social History   Tobacco Use  . Smoking status: Former Research scientist (life sciences)  . Smokeless tobacco: Never Used  Substance Use Topics  . Alcohol use: Yes    Comment: Occassional   . Drug use: No    Home Medications Prior to Admission medications   Medication Sig Start Date End Date Taking? Authorizing Provider  cyclobenzaprine (FLEXERIL) 5 MG tablet Take 1 tablet (5 mg total) by mouth at bedtime. 04/01/19   Zigmund Gottron, NP  fish oil-omega-3 fatty acids 1000 MG capsule Take 1 g by mouth daily as needed (Takes only when he feels chest pain-2-3 times per week).    [provider]  fluticasone (FLONASE)  50 MCG/ACT nasal spray Place 2 sprays into both nostrils daily. 04/26/19   Moneisha Vosler C, PA-C  hydrocortisone cream 1 % Apply to affected area 2 times daily 04/01/19   Augusto Gamble B, NP  ibuprofen (ADVIL) 800 MG tablet Take 1 tablet (800 mg total) by mouth 3 (three) times daily. 04/01/19   Zigmund Gottron, NP  meloxicam (MOBIC) 7.5 MG tablet Take 2 tablets (15 mg total) by mouth daily. 08/24/18   Orvan July, NP    Allergies    Patient has no known allergies.  Review of Systems   Review of Systems  Constitutional: Negative for chills, diaphoresis and fever.  HENT: Positive for congestion and sore throat. Negative for drooling.   Respiratory: Negative for cough and shortness of breath.   Cardiovascular: Negative for chest pain.  Gastrointestinal: Negative for abdominal pain, diarrhea, nausea and vomiting.  Musculoskeletal: Negative for neck stiffness.  Neurological: Negative for dizziness, light-headedness and headaches.  All other systems reviewed and are negative.   Physical Exam Updated Vital Signs BP (!) 176/87 (BP Location: Left Arm)   Pulse 60   Temp 98.3 F (36.8 C) (Oral)   Resp 18   Ht 5\' 11"  (1.803 m)   Wt 92.1 kg   SpO2 100%   BMI 28.31 kg/m   Physical Exam Vitals  and nursing note reviewed.  Constitutional:      General: He is not in acute distress.    Appearance: He is well-developed. He is not diaphoretic.  HENT:     Head: Normocephalic and atraumatic.     Mouth/Throat:     Mouth: Mucous membranes are moist.     Pharynx: Oropharynx is clear.     Comments: Dentition appears to be intact and stable.  No noted area of intraoral swelling or fluctuance.  No trismus or noted abnormal phonation.  Mouth opening to at least 3 finger widths.  Handles oral secretions without difficulty.  No noted facial swelling.  No sublingual swelling.  No swelling or tenderness to the submental or submandibular regions.  No swelling or tenderness into the soft tissues of the  neck. No tenderness over the trachea or thyroid. Eyes:     Conjunctiva/sclera: Conjunctivae normal.  Neck:     Comments: Some bilateral cervical lymphadenopathy. Cardiovascular:     Rate and Rhythm: Normal rate and regular rhythm.     Pulses: Normal pulses.     Heart sounds: Normal heart sounds.  Pulmonary:     Effort: Pulmonary effort is normal. No respiratory distress.     Breath sounds: Normal breath sounds.  Abdominal:     Tenderness: There is no guarding.  Musculoskeletal:     Cervical back: Normal range of motion and neck supple.  Lymphadenopathy:     Cervical: Cervical adenopathy present.  Skin:    General: Skin is warm and dry.  Neurological:     Mental Status: He is alert.  Psychiatric:        Mood and Affect: Mood and affect normal.        Speech: Speech normal.        Behavior: Behavior normal.     ED Results / Procedures / Treatments   Labs (all labs ordered are listed, but only abnormal results are displayed) Labs Reviewed - No data to display  EKG None  Radiology No results found.  Procedures Procedures (including critical care time)  Medications Ordered in ED Medications  dexamethasone (DECADRON) injection 10 mg (has no administration in time range)    ED Course  I have reviewed the triage vital signs and the nursing notes.  Pertinent labs & imaging results that were available during my care of the patient were reviewed by me and considered in my medical decision making (see chart for details).    MDM Rules/Calculators/A&P                      Patient presents with what he labels as throat pain.  Patient is nontoxic appearing, afebrile, not tachycardic, not tachypneic, not hypotensive, excellent SPO2 on room air, and is in no apparent distress.  The patient was given instructions for home care as well as return precautions. Patient voices understanding of these instructions, accepts the plan, and is comfortable with discharge.  Patient's  hypertension was noted.  He will need to follow-up with a primary care provider on this matter.  Doubt hypertensive emergency.  Final Clinical Impression(s) / ED Diagnoses Final diagnoses:  Throat pain    Rx / DC Orders ED Discharge Orders         Ordered    fluticasone (FLONASE) 50 MCG/ACT nasal spray  Daily     04/26/19 1355           Concepcion Living 04/26/19 1413    Arby Barrette, MD  04/27/19 0825  

## 2019-06-06 ENCOUNTER — Ambulatory Visit (HOSPITAL_COMMUNITY)
Admission: EM | Admit: 2019-06-06 | Discharge: 2019-06-06 | Disposition: A | Discharge status: Home / Self Care | Payer: BC Managed Care – PPO | Attending: Family Medicine | Admitting: Family Medicine

## 2019-06-06 ENCOUNTER — Other Ambulatory Visit: Payer: Self-pay

## 2019-06-06 ENCOUNTER — Encounter (HOSPITAL_COMMUNITY): Payer: Self-pay

## 2019-06-06 DIAGNOSIS — S39012A Strain of muscle, fascia and tendon of lower back, initial encounter: Secondary | ICD-10-CM | POA: Diagnosis not present

## 2019-06-06 MED ORDER — CYCLOBENZAPRINE HCL 5 MG PO TABS: 5.0000 mg | ORAL_TABLET | Freq: Every day | ORAL | 0 refills | Status: DC

## 2019-06-06 MED ORDER — MELOXICAM 7.5 MG PO TABS: 15.0000 mg | ORAL_TABLET | Freq: Every day | ORAL | 0 refills | Status: DC

## 2019-06-06 MED ORDER — CYCLOBENZAPRINE HCL 10 MG PO TABS: 10.0000 mg | ORAL_TABLET | Freq: Two times a day (BID) | ORAL | 0 refills | Status: DC | PRN

## 2019-06-06 NOTE — Discharge Instructions (Signed)
Take the Flexeril as needed for muscle spasming. Meloxicam for pain inflammation. Gentle stretching, heat and massage. Follow up as needed for continued or worsening symptoms

## 2019-06-06 NOTE — ED Triage Notes (Signed)
Pt c/o 8/10 mid lower back painx 12 days. Pt states it's too painful to bend over and touch toes. Pt was lifting heavy rock for 4 days then a wk later the back pain started.  Pt denies numbness and tingling. Pt denies loss of bowel or bladder.

## 2019-06-06 NOTE — ED Provider Notes (Signed)
[facility]    CSN: [id number] Arrival date & time: 06/06/19  1103      History   Chief Complaint Chief Complaint  Patient presents with  . Back Pain    HPI Jason Vargas is a 34 y.o. male.   Patient is a 34 year old male who presents today with lower back pain.  This started approximately 1 week ago.  Symptoms have been constant, waxing waning rates the pain as 8 out of 10.  Started after doing a lot of walking and heavy lifting.  He has been taking Aleve for the pain with minimal relief.  Denies any associated radiation of pain, numbness, tingling or weakness in extremities.  Denies any loss of bowel or bladder function.  Denies any fever or urinary symptoms.  ROS per HPI      Past Medical History:  Diagnosis Date  . Chest pain   . Hx of echocardiogram    Echo 7/14: EF 60-65%, normal WM  . Reflux     Patient Active Problem List   Diagnosis Date Noted  . Chest pain, unspecified 10/04/2012    History reviewed. No pertinent surgical history.     Home Medications    Prior to Admission medications   Medication Sig Start Date End Date Taking? Authorizing Provider  cyclobenzaprine (FLEXERIL) 10 MG tablet Take 1 tablet (10 mg total) by mouth 2 (two) times daily as needed for muscle spasms. 06/06/19   Loura Halt A, NP  meloxicam (MOBIC) 7.5 MG tablet Take 2 tablets (15 mg total) by mouth daily. 06/06/19   Annamarie Yamaguchi, Tressia Miners A, NP  fluticasone (FLONASE) 50 MCG/ACT nasal spray Place 2 sprays into both nostrils daily. 04/26/19 06/06/19  Lorayne Bender, PA-C    Family History Family History  Problem Relation Age of Onset  . Healthy Mother   . Healthy Father   . Heart attack Neg Hx   . Sudden death Neg Hx     Social History Social History   Tobacco Use  . Smoking status: Never Smoker  . Smokeless tobacco: Never Used  Substance Use Topics  . Alcohol use: Yes    Comment: Occassional   . Drug use: No     Allergies   Patient has no known allergies.   Review  of Systems Review of Systems   Physical Exam Triage Vital Signs ED Triage Vitals  Enc Vitals Group     BP 06/06/19 1205 138/86     Pulse Rate 06/06/19 1205 71     Resp 06/06/19 1205 16     Temp 06/06/19 1205 98.4 F (36.9 C)     Temp Source 06/06/19 1205 Oral     SpO2 06/06/19 1205 99 %     Weight 06/06/19 1206 201 lb (91.2 kg)     Height 06/06/19 1206 5\' 11"  (1.803 m)     Head Circumference --      Peak Flow --      Pain Score 06/06/19 1205 8     Pain Loc --      Pain Edu? --      Excl. in [place]? --    No data found.  Updated Vital Signs BP 138/86   Pulse 71   Temp 98.4 F (36.9 C) (Oral)   Resp 16   Ht 5\' 11"  (1.803 m)   Wt 201 lb (91.2 kg)   SpO2 99%   BMI 28.03 kg/m   Visual Acuity Right Eye Distance:   Left Eye Distance:  Bilateral Distance:    Right Eye Near:   Left Eye Near:    Bilateral Near:     Physical Exam Vitals and nursing note reviewed.  Constitutional:      Appearance: Normal appearance.  HENT:     Head: Normocephalic and atraumatic.     Nose: Nose normal.  Eyes:     Conjunctiva/sclera: Conjunctivae normal.  Pulmonary:     Effort: Pulmonary effort is normal.  Musculoskeletal:        General: Tenderness present.     Cervical back: Normal range of motion.     Lumbar back: Tenderness present. No bony tenderness. Decreased range of motion.       Back:     Comments: Not specifically tender to palpation but tender with bending forward and touching toes.   Skin:    General: Skin is warm and dry.  Neurological:     Mental Status: He is alert.  Psychiatric:        Mood and Affect: Mood normal.      UC Treatments / Results  Labs (all labs ordered are listed, but only abnormal results are displayed) Labs Reviewed - No data to display  EKG   Radiology No results found.  Procedures Procedures (including critical care time)  Medications Ordered in UC Medications - No data to display  Initial Impression / Assessment and Plan /  UC Course  I have reviewed the triage vital signs and the nursing notes.  Pertinent labs & imaging results that were available during my care of the patient were reviewed by me and considered in my medical decision making (see chart for details).     Lumbar strain-treating with Flexeril and meloxicam. Gentle stretching, heat and massage.  Giving work note for light duty x1 week and no heavy lifting. Follow up as needed for continued or worsening symptoms  Final Clinical Impressions(s) / UC Diagnoses   Final diagnoses:  Strain of lumbar region, initial encounter     Discharge Instructions     Take the Flexeril as needed for muscle spasming. Meloxicam for pain inflammation. Gentle stretching, heat and massage. Follow up as needed for continued or worsening symptoms     ED Prescriptions    Medication Sig Dispense Auth. Provider   cyclobenzaprine (FLEXERIL) 5 MG tablet  (Status: Discontinued) Take 1 tablet (5 mg total) by mouth at bedtime. 15 tablet Fielding Mault A, NP   meloxicam (MOBIC) 7.5 MG tablet Take 2 tablets (15 mg total) by mouth daily. 30 tablet Vonzell Lindblad A, NP   cyclobenzaprine (FLEXERIL) 10 MG tablet Take 1 tablet (10 mg total) by mouth 2 (two) times daily as needed for muscle spasms. 20 tablet Dahlia Byes A, NP     PDMP not reviewed this encounter.   Janace Aris, NP 06/06/19 1335

## 2019-06-19 ENCOUNTER — Ambulatory Visit (HOSPITAL_COMMUNITY): Admission: EM | Admit: 2019-06-19 | Discharge: 2019-06-19 | Discharge status: Absent without leave | Payer: Self-pay

## 2019-06-19 ENCOUNTER — Other Ambulatory Visit: Payer: Self-pay

## 2019-06-19 ENCOUNTER — Ambulatory Visit (INDEPENDENT_AMBULATORY_CARE_PROVIDER_SITE_OTHER): Payer: BC Managed Care – PPO

## 2019-06-19 ENCOUNTER — Ambulatory Visit (HOSPITAL_COMMUNITY)
Admission: EM | Admit: 2019-06-19 | Discharge: 2019-06-19 | Disposition: A | Discharge status: Home / Self Care | Payer: BC Managed Care – PPO | Attending: Family Medicine | Admitting: Family Medicine

## 2019-06-19 ENCOUNTER — Encounter (HOSPITAL_COMMUNITY): Payer: Self-pay

## 2019-06-19 DIAGNOSIS — G8929 Other chronic pain: Secondary | ICD-10-CM | POA: Diagnosis not present

## 2019-06-19 DIAGNOSIS — S39012D Strain of muscle, fascia and tendon of lower back, subsequent encounter: Secondary | ICD-10-CM | POA: Insufficient documentation

## 2019-06-19 DIAGNOSIS — M545 Low back pain, unspecified: Secondary | ICD-10-CM

## 2019-06-19 LAB — COMPREHENSIVE METABOLIC PANEL
ALT: 29 U/L (ref 0–44)
AST: 43 U/L — ABNORMAL HIGH (ref 15–41)
Albumin: 4.4 g/dL (ref 3.5–5.0)
Alkaline Phosphatase: 52 U/L (ref 38–126)
Anion gap: 10 (ref 5–15)
BUN: 11 mg/dL (ref 6–20)
CO2: 26 mmol/L (ref 22–32)
Calcium: 9.8 mg/dL (ref 8.9–10.3)
Chloride: 102 mmol/L (ref 98–111)
Creatinine, Ser: 0.94 mg/dL (ref 0.61–1.24)
GFR calc Af Amer: >60 mL/min (ref >60)
GFR calc non Af Amer: >60 mL/min (ref >60)
Glucose, Bld: 98 mg/dL (ref 70–99)
Potassium: 4.6 mmol/L (ref 3.5–5.1)
Sodium: 138 mmol/L (ref 135–145)
Total Bilirubin: 0.7 mg/dL (ref 0.3–1.2)
Total Protein: 7.7 g/dL (ref 6.5–8.1)

## 2019-06-19 LAB — CBC WITH DIFFERENTIAL/PLATELET
Abs Immature Granulocytes: 0.01 10*3/uL (ref 0.00–0.07)
Basophils Absolute: 0.1 10*3/uL (ref 0.0–0.1)
Basophils Relative: 1 %
Eosinophils Absolute: 0.2 10*3/uL (ref 0.0–0.5)
Eosinophils Relative: 5 %
HCT: 46.2 % (ref 39.0–52.0)
Hemoglobin: 15.2 g/dL (ref 13.0–17.0)
Immature Granulocytes: 0 %
Lymphocytes Relative: 48 %
Lymphs Abs: 1.8 10*3/uL (ref 0.7–4.0)
MCH: 27.9 pg (ref 26.0–34.0)
MCHC: 32.9 g/dL (ref 30.0–36.0)
MCV: 84.8 fL (ref 80.0–100.0)
Monocytes Absolute: 0.3 10*3/uL (ref 0.1–1.0)
Monocytes Relative: 8 %
Neutro Abs: 1.5 10*3/uL — ABNORMAL LOW (ref 1.7–7.7)
Neutrophils Relative %: 38 %
Platelets: 210 10*3/uL (ref 150–400)
RBC: 5.45 MIL/uL (ref 4.22–5.81)
RDW: 12.9 % (ref 11.5–15.5)
WBC: 3.8 10*3/uL — ABNORMAL LOW (ref 4.0–10.5)
nRBC: 0 % (ref 0.0–0.2)

## 2019-06-19 LAB — SEDIMENTATION RATE: Sed Rate: 2 mm/hr (ref 0–16)

## 2019-06-19 LAB — C-REACTIVE PROTEIN: CRP: 0.8 mg/dL (ref <1.0)

## 2019-06-19 MED ORDER — MELOXICAM 7.5 MG PO TABS: 15.0000 mg | ORAL_TABLET | Freq: Every day | ORAL | 0 refills | Status: DC

## 2019-06-19 MED ORDER — CYCLOBENZAPRINE HCL 10 MG PO TABS: 10.0000 mg | ORAL_TABLET | Freq: Two times a day (BID) | ORAL | 0 refills | Status: DC | PRN

## 2019-06-19 NOTE — ED Provider Notes (Signed)
[facility]    CSN: [id number] Arrival date & time: 06/19/19  1354      History   Chief Complaint Chief Complaint  Patient presents with  . Back Pain    HPI Jason Vargas is a 34 y.o. male.   Reports that he has been having low back pain for the last 2 weeks.  He was seen here on 06/06/2019.  He was treated with meloxicam and Flexeril.  Patient denies having primary care physician.  Patient reports that he is a job that requires him to lift and move heavy objects very often.  He also reports that he has a "test" coming up that is going to require him to lift over 250 pounds.  He feels like he is not safely going to be able to do this.  He feels like he needs time to rest and allow his back to heal.  He has been seen in this office multiple times for multiple musculoskeletal complaints.  Has not had low back x-rayed.  Inquiring about the cause of this pain.  Reports low back tenderness, pain with twisting, bending, lifting.  Denies pain radiating down his legs or up his back or around either side.  Denies headache, sore throat, nausea, vomiting, rash, fever, other symptoms.  ROS per HPI  The history is provided by the patient.  Back Pain   Past Medical History:  Diagnosis Date  . Chest pain   . Hx of echocardiogram    Echo 7/14: EF 60-65%, normal WM  . Reflux     Patient Active Problem List   Diagnosis Date Noted  . Chest pain, unspecified 10/04/2012    History reviewed. No pertinent surgical history.     Home Medications    Prior to Admission medications   Medication Sig Start Date End Date Taking? Authorizing Provider  cyclobenzaprine (FLEXERIL) 10 MG tablet Take 1 tablet (10 mg total) by mouth 2 (two) times daily as needed for muscle spasms. 06/19/19   [name], NP  meloxicam (MOBIC) 7.5 MG tablet Take 2 tablets (15 mg total) by mouth daily. 06/19/19   [name], NP  fluticasone (FLONASE) 50 MCG/ACT nasal spray Place 2 sprays into both  nostrils daily. 04/26/19 06/06/19  Anselm Pancoast, PA-C    Family History Family History  Problem Relation Age of Onset  . Healthy Mother   . Healthy Father   . Heart attack Neg Hx   . Sudden death Neg Hx     Social History Social History   Tobacco Use  . Smoking status: Never Smoker  . Smokeless tobacco: Never Used  Substance Use Topics  . Alcohol use: Yes    Comment: Occassional   . Drug use: No     Allergies   Patient has no known allergies.   Review of Systems Review of Systems  Musculoskeletal: Positive for back pain.     Physical Exam Triage Vital Signs ED Triage Vitals [06/19/19 1403]  Enc Vitals Group     BP 137/79     Pulse Rate 68     Resp 15     Temp 98.5 F (36.9 C)     Temp Source Oral     SpO2 100 %     Weight      Height      Head Circumference      Peak Flow      Pain Score 6     Pain Loc      Pain  Edu?      Excl. in [place]?    No data found.  Updated Vital Signs BP 137/79 (BP Location: Right Arm)   Pulse 68   Temp 98.5 F (36.9 C) (Oral)   Resp 15   SpO2 100%   Visual Acuity Right Eye Distance:   Left Eye Distance:   Bilateral Distance:    Right Eye Near:   Left Eye Near:    Bilateral Near:     Physical Exam Vitals and nursing note reviewed.  Constitutional:      General: He is not in acute distress.    Appearance: Normal appearance. He is well-developed and normal weight. He is not ill-appearing.  HENT:     Head: Normocephalic and atraumatic.  Eyes:     Conjunctiva/sclera: Conjunctivae normal.  Cardiovascular:     Rate and Rhythm: Normal rate and regular rhythm.     Heart sounds: Normal heart sounds. No murmur.  Pulmonary:     Effort: Pulmonary effort is normal. No respiratory distress.     Breath sounds: Normal breath sounds. No stridor. No wheezing, rhonchi or rales.  Chest:     Chest wall: No tenderness.  Abdominal:     General: Bowel sounds are normal.     Palpations: Abdomen is soft.     Tenderness: There is  no abdominal tenderness.  Musculoskeletal:        General: Tenderness present. No swelling or deformity.     Cervical back: Normal range of motion and neck supple.  Skin:    General: Skin is warm and dry.     Capillary Refill: Capillary refill takes less than 2 seconds.  Neurological:     General: No focal deficit present.     Mental Status: He is alert and oriented to person, place, and time.      UC Treatments / Results  Labs (all labs ordered are listed, but only abnormal results are displayed) Labs Reviewed  CBC WITH DIFFERENTIAL/PLATELET - Abnormal; Notable for the following components:      Result Value   WBC 3.8 (*)    Neutro Abs 1.5 (*)    All other components within normal limits  COMPREHENSIVE METABOLIC PANEL - Abnormal; Notable for the following components:   AST 43 (*)    All other components within normal limits  URINE CULTURE  SEDIMENTATION RATE  C-REACTIVE PROTEIN    EKG   Radiology No results found.  Procedures Procedures (including critical care time)  Medications Ordered in UC Medications - No data to display  Initial Impression / Assessment and Plan / UC Course  I have reviewed the triage vital signs and the nursing notes.  Pertinent labs & imaging results that were available during my care of the patient were reviewed by me and considered in my medical decision making (see chart for details).     Chronic low back pain: Has been present for over 2 months.  Intermittent tenderness to his low back.  Today on exam, there is no swelling, mild tenderness to bilateral low back.  X-ray ordered today was negative for any acute bony abnormality or obvious soft tissue abnormality.  UA negative for infection in office today.  Will check CBC, CMP, inflammatory markers to rule out infectious or rheumatologic causes of his pain.  If all of these labs are normal, patient instructed that he can follow-up with orthopedics as needed.  Discussed with patient the need  for establishing primary care.  Instructed patient to follow-up  with the ER for loss of extremity sensation, strength, loss of bowel or bladder control, other concerning symptoms.  Patient verbalizes understanding and agrees with treatment plan. Final Clinical Impressions(s) / UC Diagnoses   Final diagnoses:  Chronic bilateral low back pain, unspecified whether sciatica present  Back strain, subsequent encounter     Discharge Instructions     Have refilled your muscle relaxers and meloxicam.  If your blood work looks great and there is no other cause or reason for your back pain, would have you follow-up with orthopedics.  I have attached information for them as well.  Really need to establish a primary care provider.  Follow-up with primary care orthopedics as needed.     ED Prescriptions    Medication Sig Dispense Auth. Provider   cyclobenzaprine (FLEXERIL) 10 MG tablet Take 1 tablet (10 mg total) by mouth 2 (two) times daily as needed for muscle spasms. 20 tablet Faustino Congress, NP   meloxicam (MOBIC) 7.5 MG tablet Take 2 tablets (15 mg total) by mouth daily. 30 tablet Faustino Congress, NP     PDMP not reviewed this encounter.   Faustino Congress, NP 06/21/19 2245

## 2019-06-19 NOTE — Discharge Instructions (Signed)
Have refilled your muscle relaxers and meloxicam.  If your blood work looks great and there is no other cause or reason for your back pain, would have you follow-up with orthopedics.  I have attached information for them as well.  Really need to establish a primary care provider.  Follow-up with primary care orthopedics as needed.

## 2019-06-19 NOTE — ED Triage Notes (Signed)
F/u back pain

## 2019-06-20 LAB — URINE CULTURE: Culture: NO GROWTH

## 2019-06-27 DIAGNOSIS — S335XXD Sprain of ligaments of lumbar spine, subsequent encounter: Secondary | ICD-10-CM | POA: Diagnosis not present

## 2019-06-27 DIAGNOSIS — K219 Gastro-esophageal reflux disease without esophagitis: Secondary | ICD-10-CM | POA: Diagnosis not present

## 2019-06-27 DIAGNOSIS — Z131 Encounter for screening for diabetes mellitus: Secondary | ICD-10-CM | POA: Diagnosis not present

## 2019-06-27 DIAGNOSIS — Z13220 Encounter for screening for lipoid disorders: Secondary | ICD-10-CM | POA: Diagnosis not present

## 2019-06-27 DIAGNOSIS — Z0000 Encounter for general adult medical examination without abnormal findings: Secondary | ICD-10-CM | POA: Diagnosis not present

## 2019-06-28 DIAGNOSIS — Z13220 Encounter for screening for lipoid disorders: Secondary | ICD-10-CM | POA: Diagnosis not present

## 2019-06-28 DIAGNOSIS — S335XXD Sprain of ligaments of lumbar spine, subsequent encounter: Secondary | ICD-10-CM | POA: Diagnosis not present

## 2019-06-28 DIAGNOSIS — Z0000 Encounter for general adult medical examination without abnormal findings: Secondary | ICD-10-CM | POA: Diagnosis not present

## 2019-06-28 DIAGNOSIS — E559 Vitamin D deficiency, unspecified: Secondary | ICD-10-CM | POA: Diagnosis not present

## 2019-06-28 DIAGNOSIS — Z131 Encounter for screening for diabetes mellitus: Secondary | ICD-10-CM | POA: Diagnosis not present

## 2019-06-28 MED FILL — IBUPROFEN 800 MG TABS: 800 | 10 days supply | Qty: 30 | Fill #0

## 2019-06-28 MED FILL — CYCLOBENZAPRINE HCL 10 MG T: 10 | 30 days supply | Qty: 30 | Fill #0

## 2019-07-03 ENCOUNTER — Encounter (HOSPITAL_COMMUNITY): Payer: Self-pay

## 2019-07-03 ENCOUNTER — Ambulatory Visit (HOSPITAL_COMMUNITY)
Admission: EM | Admit: 2019-07-03 | Discharge: 2019-07-03 | Disposition: A | Discharge status: Home / Self Care | Payer: BC Managed Care – PPO | Attending: Emergency Medicine | Admitting: Emergency Medicine

## 2019-07-03 ENCOUNTER — Other Ambulatory Visit: Payer: Self-pay

## 2019-07-03 DIAGNOSIS — S99929A Unspecified injury of unspecified foot, initial encounter: Secondary | ICD-10-CM

## 2019-07-03 DIAGNOSIS — S91209A Unspecified open wound of unspecified toe(s) with damage to nail, initial encounter: Secondary | ICD-10-CM | POA: Diagnosis not present

## 2019-07-03 MED ORDER — NAPROXEN 500 MG PO TABS: 500.0000 mg | ORAL_TABLET | Freq: Two times a day (BID) | ORAL | 0 refills | Status: DC

## 2019-07-03 MED ORDER — CEPHALEXIN 500 MG PO CAPS: 500.0000 mg | ORAL_CAPSULE | Freq: Four times a day (QID) | ORAL | 0 refills | Status: DC

## 2019-07-03 NOTE — ED Provider Notes (Signed)
[place]    CSN: [id number] Arrival date & time: 07/03/19  1008      History   Chief Complaint Chief Complaint  Patient presents with  . toe nail injury    HPI Jason Vargas is a 34 y.o. male.   Was playing soccer yesterday and RT big toe nail was injured and torn half way off. Pt removed the nail with some of the tissue under the nail also. Thick drainage, swelling, painful, states that he was trying to take the entire nail off. Has not taken anything pta     Past Medical History:  Diagnosis Date  . Chest pain   . Hx of echocardiogram    Echo 7/14: EF 60-65%, normal WM  . Reflux     Patient Active Problem List   Diagnosis Date Noted  . Chest pain, unspecified 10/04/2012    History reviewed. No pertinent surgical history.     Home Medications    Prior to Admission medications   Medication Sig Start Date End Date Taking? Authorizing Provider  cephALEXin (KEFLEX) 500 MG capsule Take 1 capsule (500 mg total) by mouth 4 (four) times daily. 07/03/19   Marney Setting, NP  cyclobenzaprine (FLEXERIL) 10 MG tablet Take 1 tablet (10 mg total) by mouth 2 (two) times daily as needed for muscle spasms. 06/19/19   Faustino Congress, NP  meloxicam (MOBIC) 7.5 MG tablet Take 2 tablets (15 mg total) by mouth daily. 06/19/19   Faustino Congress, NP  naproxen (NAPROSYN) 500 MG tablet Take 1 tablet (500 mg total) by mouth 2 (two) times daily. 07/03/19   Marney Setting, NP  fluticasone (FLONASE) 50 MCG/ACT nasal spray Place 2 sprays into both nostrils daily. 04/26/19 06/06/19  Lorayne Bender, PA-C    Family History Family History  Problem Relation Age of Onset  . Healthy Mother   . Healthy Father   . Heart attack Neg Hx   . Sudden death Neg Hx     Social History Social History   Tobacco Use  . Smoking status: Never Smoker  . Smokeless tobacco: Never Used  Substance Use Topics  . Alcohol use: Yes    Comment: Occassional   . Drug use: No      Allergies   Patient has no known allergies.   Review of Systems Review of Systems  Constitutional: Negative.   Respiratory: Negative.   Cardiovascular: Negative.   Musculoskeletal: Positive for joint swelling.  Skin: Positive for wound.  Neurological: Negative.      Physical Exam Triage Vital Signs ED Triage Vitals  Enc Vitals Group     BP 07/03/19 1030 140/87     Pulse Rate 07/03/19 1030 78     Resp 07/03/19 1030 16     Temp 07/03/19 1030 97.6 F (36.4 C)     Temp Source 07/03/19 1030 Oral     SpO2 07/03/19 1030 100 %     Weight --      Height --      Head Circumference --      Peak Flow --      Pain Score 07/03/19 1028 7     Pain Loc --      Pain Edu? --      Excl. in [place]? --    No data found.  Updated Vital Signs BP 140/87 (BP Location: Left Arm)   Pulse 78   Temp 97.6 F (36.4 C) (Oral)   Resp 16   SpO2 100%  Visual Acuity  Physical Exam Constitutional:      Appearance: Normal appearance.  Cardiovascular:     Rate and Rhythm: Normal rate.  Pulmonary:     Effort: Pulmonary effort is normal.  Musculoskeletal:        General: Swelling, tenderness and signs of injury present.     Comments: RT big toe small amount of edema, partial nail bed removed. Small amount of thick yellow drainage.   Skin:    Capillary Refill: Capillary refill takes less than 2 seconds.     Findings: Erythema present.     Comments: Warm pink  To area small amount of edema and erythema   Neurological:     Mental Status: He is alert.      UC Treatments / Results  Labs (all labs ordered are listed, but only abnormal results are displayed) Labs Reviewed - No data to display  EKG   Radiology No results found.  Procedures Procedures (including critical care time)  Medications Ordered in UC Medications - No data to display  Initial Impression / Assessment and Plan / UC Course  I have reviewed the triage vital signs and the nursing notes.  Pertinent labs & imaging  results that were available during my care of the patient were reviewed by me and considered in my medical decision making (see chart for details).    Do not try to remove nail bed  Wash dry area , apply a small amount of neosporin to nail bed and keep covered  Take full dose of antibiotics take pain meds as needed    Final Clinical Impressions(s) / UC Diagnoses   Final diagnoses:  Injury of nail bed of toe  Avulsion of toenail, initial encounter     Discharge Instructions     Do not try to remove nail bed  Wash dry area , apply a small amount of neosporin to nail bed and keep covered  Take full dose of antibiotics take pain meds as needed       ED Prescriptions    Medication Sig Dispense Auth. Provider   naproxen (NAPROSYN) 500 MG tablet Take 1 tablet (500 mg total) by mouth 2 (two) times daily. 30 tablet Maple Mirza L, NP   cephALEXin (KEFLEX) 500 MG capsule Take 1 capsule (500 mg total) by mouth 4 (four) times daily. 20 capsule Coralyn Mark, NP     PDMP not reviewed this encounter.   Coralyn Mark, NP 07/03/19 1105

## 2019-07-03 NOTE — ED Triage Notes (Signed)
Reports he was playing soccer Friday when injury occurred. Toe is not injured, but reports his toenail separated from the nail bed.

## 2019-07-03 NOTE — Discharge Instructions (Addendum)
Do not try to remove nail bed  Wash dry area , apply a small amount of neosporin to nail bed and keep covered  Take full dose of antibiotics take pain meds as needed

## 2019-07-20 DIAGNOSIS — J302 Other seasonal allergic rhinitis: Secondary | ICD-10-CM | POA: Diagnosis not present

## 2019-07-20 DIAGNOSIS — S335XXD Sprain of ligaments of lumbar spine, subsequent encounter: Secondary | ICD-10-CM | POA: Diagnosis not present

## 2019-07-20 DIAGNOSIS — K219 Gastro-esophageal reflux disease without esophagitis: Secondary | ICD-10-CM | POA: Diagnosis not present

## 2019-08-19 DIAGNOSIS — R1084 Generalized abdominal pain: Secondary | ICD-10-CM | POA: Diagnosis not present

## 2019-12-13 ENCOUNTER — Ambulatory Visit (INDEPENDENT_AMBULATORY_CARE_PROVIDER_SITE_OTHER): Payer: BC Managed Care – PPO

## 2019-12-13 ENCOUNTER — Encounter (HOSPITAL_COMMUNITY): Payer: Self-pay

## 2019-12-13 ENCOUNTER — Ambulatory Visit (HOSPITAL_COMMUNITY)
Admission: EM | Admit: 2019-12-13 | Discharge: 2019-12-13 | Disposition: A | Discharge status: Home / Self Care | Payer: BC Managed Care – PPO | Attending: Emergency Medicine | Admitting: Emergency Medicine

## 2019-12-13 ENCOUNTER — Ambulatory Visit (HOSPITAL_COMMUNITY)
Admission: EM | Admit: 2019-12-13 | Discharge: 2019-12-13 | Disposition: A | Discharge status: Home / Self Care | Payer: Medicaid Other

## 2019-12-13 ENCOUNTER — Other Ambulatory Visit: Payer: Self-pay

## 2019-12-13 DIAGNOSIS — M25532 Pain in left wrist: Secondary | ICD-10-CM | POA: Diagnosis not present

## 2019-12-13 DIAGNOSIS — W010XXA Fall on same level from slipping, tripping and stumbling without subsequent striking against object, initial encounter: Secondary | ICD-10-CM

## 2019-12-13 MED ORDER — IBUPROFEN 600 MG PO TABS: 600.0000 mg | ORAL_TABLET | Freq: Four times a day (QID) | ORAL | 0 refills | Status: DC | PRN

## 2019-12-13 NOTE — ED Triage Notes (Signed)
Pt presents with swelling and pain in left wrist x 1 week. Pt reports he was running and fell over his lefty wrist. Ibuprofen gives relief.

## 2019-12-13 NOTE — ED Provider Notes (Signed)
[facility]    CSN: [id number] Arrival date & time: 12/13/19  1230      History   Chief Complaint Chief Complaint  Patient presents with  . Wrist Pain    HPI Jason Vargas is a 34 y.o. male.   Patient presents with pain and swelling in his left wrist x1 week after he fell.  He denies numbness, paresthesias, weakness in his fingers or hand.  No open wounds.  He has been taking ibuprofen for the discomfort with moderate relief.  The history is provided by the patient.    Past Medical History:  Diagnosis Date  . Chest pain   . Hx of echocardiogram    Echo 7/14: EF 60-65%, normal WM  . Reflux     Patient Active Problem List   Diagnosis Date Noted  . Chest pain, unspecified 10/04/2012    History reviewed. No pertinent surgical history.     Home Medications    Prior to Admission medications   Medication Sig Start Date End Date Taking? Authorizing Provider  cephALEXin (KEFLEX) 500 MG capsule Take 1 capsule (500 mg total) by mouth 4 (four) times daily. 07/03/19   Coralyn Mark, NP  cyclobenzaprine (FLEXERIL) 10 MG tablet Take 1 tablet (10 mg total) by mouth 2 (two) times daily as needed for muscle spasms. 06/19/19   [name], NP  ibuprofen (ADVIL) 600 MG tablet Take 1 tablet (600 mg total) by mouth every 6 (six) hours as needed. 12/13/19   Mickie Bail, NP  meloxicam (MOBIC) 7.5 MG tablet Take 2 tablets (15 mg total) by mouth daily. 06/19/19   [name], NP  naproxen (NAPROSYN) 500 MG tablet Take 1 tablet (500 mg total) by mouth 2 (two) times daily. 07/03/19   Coralyn Mark, NP  fluticasone (FLONASE) 50 MCG/ACT nasal spray Place 2 sprays into both nostrils daily. 04/26/19 06/06/19  Anselm Pancoast, PA-C    Family History Family History  Problem Relation Age of Onset  . Healthy Mother   . Healthy Father   . Heart attack Neg Hx   . Sudden death Neg Hx     Social History Social History   Tobacco Use  . Smoking status: Never  Smoker  . Smokeless tobacco: Never Used  Vaping Use  . Vaping Use: Never used  Substance Use Topics  . Alcohol use: Yes    Comment: Occassional   . Drug use: No     Allergies   Patient has no known allergies.   Review of Systems Review of Systems  Constitutional: Negative for chills and fever.  HENT: Negative for ear pain and sore throat.   Eyes: Negative for pain and visual disturbance.  Respiratory: Negative for cough and shortness of breath.   Cardiovascular: Negative for chest pain and palpitations.  Gastrointestinal: Negative for abdominal pain and vomiting.  Genitourinary: Negative for dysuria and hematuria.  Musculoskeletal: Positive for arthralgias. Negative for back pain.  Skin: Negative for color change and rash.  Neurological: Negative for seizures, syncope, weakness and numbness.  All other systems reviewed and are negative.    Physical Exam Triage Vital Signs ED Triage Vitals  Enc Vitals Group     BP      Pulse      Resp      Temp      Temp src      SpO2      Weight      Height      Head  Circumference      Peak Flow      Pain Score      Pain Loc      Pain Edu?      Excl. in GC?    No data found.  Updated Vital Signs BP 128/86 (BP Location: Right Arm)   Pulse 60   Temp 98.3 F (36.8 C) (Oral)   Resp 15   SpO2 98%   Visual Acuity Right Eye Distance:   Left Eye Distance:   Bilateral Distance:    Right Eye Near:   Left Eye Near:    Bilateral Near:     Physical Exam Vitals and nursing note reviewed.  Constitutional:      General: He is not in acute distress.    Appearance: He is well-developed.  HENT:     Head: Normocephalic and atraumatic.     Mouth/Throat:     Mouth: Mucous membranes are moist.  Eyes:     Conjunctiva/sclera: Conjunctivae normal.  Cardiovascular:     Rate and Rhythm: Normal rate and regular rhythm.     Heart sounds: No murmur heard.   Pulmonary:     Effort: Pulmonary effort is normal. No respiratory  distress.     Breath sounds: Normal breath sounds.  Abdominal:     Palpations: Abdomen is soft.     Tenderness: There is no abdominal tenderness.  Musculoskeletal:        General: Swelling and tenderness present. Normal range of motion.       Hands:     Cervical back: Neck supple.     Comments: Left wrist tender to palpation, mild edema, no open wounds, no erythema.   Skin:    General: Skin is warm and dry.     Capillary Refill: Capillary refill takes less than 2 seconds.     Findings: No bruising, erythema, lesion or rash.  Neurological:     General: No focal deficit present.     Mental Status: He is alert and oriented to person, place, and time.     Sensory: No sensory deficit.     Motor: No weakness.     Gait: Gait normal.  Psychiatric:        Mood and Affect: Mood normal.        Behavior: Behavior normal.      UC Treatments / Results  Labs (all labs ordered are listed, but only abnormal results are displayed) Labs Reviewed - No data to display  EKG   Radiology DG Wrist Complete Left  Result Date: 12/13/2019 CLINICAL DATA:  Fall 1 week ago with persistent wrist pain, initial encounter EXAM: LEFT WRIST - COMPLETE 3+ VIEW COMPARISON:  None. FINDINGS: There is no evidence of fracture or dislocation. There is no evidence of arthropathy or other focal bone abnormality. Soft tissues are unremarkable. IMPRESSION: No acute abnormality noted. Electronically Signed   By: Alcide Clever M.D.   On: 12/13/2019 17:01    Procedures Procedures (including critical care time)  Medications Ordered in UC Medications - No data to display  Initial Impression / Assessment and Plan / UC Course  I have reviewed the triage vital signs and the nursing notes.  Pertinent labs & imaging results that were available during my care of the patient were reviewed by me and considered in my medical decision making (see chart for details).   Left wrist pain.  X-ray negative.  Treating with  ibuprofen, rest, elevation, ice packs.  Instructed patient to follow-up with  orthopedics if his symptoms are not improving.  Patient agrees to plan of care.     Final Clinical Impressions(s) / UC Diagnoses   Final diagnoses:  Left wrist pain     Discharge Instructions     Take the ibuprofen as prescribed.  Rest and elevate your wrist.  Apply ice packs 2-3 times a day for up to 20 minutes each.    Follow up with an orthopedist if your symptoms are not improving.        ED Prescriptions    Medication Sig Dispense Auth. Provider   ibuprofen (ADVIL) 600 MG tablet Take 1 tablet (600 mg total) by mouth every 6 (six) hours as needed. 30 tablet Mickie Bail, NP     PDMP not reviewed this encounter.   Mickie Bail, NP 12/13/19 1719

## 2019-12-13 NOTE — Discharge Instructions (Addendum)
Take the ibuprofen as prescribed.  Rest and elevate your wrist.  Apply ice packs 2-3 times a day for up to 20 minutes each.    Follow up with an orthopedist if your symptoms are not improving.

## 2019-12-14 DIAGNOSIS — M65839 Other synovitis and tenosynovitis, unspecified forearm: Secondary | ICD-10-CM | POA: Diagnosis not present

## 2019-12-23 DIAGNOSIS — M65839 Other synovitis and tenosynovitis, unspecified forearm: Secondary | ICD-10-CM | POA: Diagnosis not present

## 2020-02-03 DIAGNOSIS — Z20828 Contact with and (suspected) exposure to other viral communicable diseases: Secondary | ICD-10-CM | POA: Diagnosis not present

## 2020-02-03 DIAGNOSIS — Z1152 Encounter for screening for COVID-19: Secondary | ICD-10-CM | POA: Diagnosis not present

## 2020-02-03 DIAGNOSIS — R109 Unspecified abdominal pain: Secondary | ICD-10-CM | POA: Diagnosis not present

## 2020-02-03 DIAGNOSIS — Z418 Encounter for other procedures for purposes other than remedying health state: Secondary | ICD-10-CM | POA: Diagnosis not present

## 2020-02-03 DIAGNOSIS — M25532 Pain in left wrist: Secondary | ICD-10-CM | POA: Diagnosis not present

## 2020-02-06 DIAGNOSIS — J302 Other seasonal allergic rhinitis: Secondary | ICD-10-CM | POA: Diagnosis not present

## 2020-04-13 ENCOUNTER — Ambulatory Visit: Payer: Medicaid Other | Attending: Family

## 2020-04-13 DIAGNOSIS — Z23 Encounter for immunization: Secondary | ICD-10-CM

## 2020-05-04 ENCOUNTER — Ambulatory Visit: Payer: Medicaid Other | Attending: Family

## 2020-05-04 DIAGNOSIS — Z23 Encounter for immunization: Secondary | ICD-10-CM

## 2020-05-23 ENCOUNTER — Encounter (HOSPITAL_COMMUNITY): Payer: Self-pay

## 2020-05-23 ENCOUNTER — Other Ambulatory Visit: Payer: Self-pay

## 2020-05-23 ENCOUNTER — Ambulatory Visit (HOSPITAL_COMMUNITY)
Admission: EM | Admit: 2020-05-23 | Discharge: 2020-05-23 | Disposition: A | Discharge status: Home / Self Care | Payer: Medicaid Other | Attending: Urgent Care | Admitting: Urgent Care

## 2020-05-23 DIAGNOSIS — Z791 Long term (current) use of non-steroidal anti-inflammatories (NSAID): Secondary | ICD-10-CM | POA: Diagnosis not present

## 2020-05-23 DIAGNOSIS — R1084 Generalized abdominal pain: Secondary | ICD-10-CM | POA: Diagnosis not present

## 2020-05-23 DIAGNOSIS — R519 Headache, unspecified: Secondary | ICD-10-CM

## 2020-05-23 DIAGNOSIS — R0981 Nasal congestion: Secondary | ICD-10-CM | POA: Insufficient documentation

## 2020-05-23 DIAGNOSIS — Z20822 Contact with and (suspected) exposure to covid-19: Secondary | ICD-10-CM | POA: Diagnosis not present

## 2020-05-23 DIAGNOSIS — J069 Acute upper respiratory infection, unspecified: Secondary | ICD-10-CM | POA: Diagnosis present

## 2020-05-23 LAB — SARS CORONAVIRUS 2 (TAT 6-24 HRS): SARS Coronavirus 2: NEGATIVE

## 2020-05-23 MED ORDER — FLUTICASONE PROPIONATE 50 MCG/ACT NA SUSP: 2.0000 | Freq: Every day | NASAL | 0 refills | Status: DC

## 2020-05-23 MED ORDER — PSEUDOEPHEDRINE HCL 60 MG PO TABS: 60.0000 mg | ORAL_TABLET | Freq: Three times a day (TID) | ORAL | 0 refills | Status: DC | PRN

## 2020-05-23 MED ORDER — CETIRIZINE HCL 10 MG PO TABS: 10.0000 mg | ORAL_TABLET | Freq: Every day | ORAL | 0 refills | Status: DC

## 2020-05-23 NOTE — Discharge Instructions (Addendum)

## 2020-05-23 NOTE — ED Triage Notes (Signed)
Pt states he has LLQ pain.

## 2020-05-23 NOTE — ED Provider Notes (Signed)
[facility]   MRN: [id number] DOB: 08-22-1985  Subjective:   Jason Vargas is a 35 y.o. male presenting for 4-day history of sinus congestion, sinus headaches, throat pain, upset stomach, malaise and fatigue, left lower abdominal pain.  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  He was advised to get a doctor's note as they are concerned that he would have significant difficulty doing the strenuous physical activity for straining.  Patient is COVID vaccinated, no booster.  Denies fever, runny or stuffy nose, ear pain, chest pain, shortness of breath, body aches, diarrhea, vomiting.  Denies history of chronic conditions.  Patient has used Alka-Seltzer once.  Regarding his abdominal pain he does admit that it is very transient and mild in nature, reports doing a lot of core exercises, crunches and planking.  No current facility-administered medications for this encounter.  Current Outpatient Medications:  .  cephALEXin (KEFLEX) 500 MG capsule, Take 1 capsule (500 mg total) by mouth 4 (four) times daily., Disp: 20 capsule, Rfl: 0 .  cyclobenzaprine (FLEXERIL) 10 MG tablet, Take 1 tablet (10 mg total) by mouth 2 (two) times daily as needed for muscle spasms., Disp: 20 tablet, Rfl: 0 .  ibuprofen (ADVIL) 600 MG tablet, Take 1 tablet (600 mg total) by mouth every 6 (six) hours as needed., Disp: 30 tablet, Rfl: 0 .  meloxicam (MOBIC) 7.5 MG tablet, Take 2 tablets (15 mg total) by mouth daily., Disp: 30 tablet, Rfl: 0 .  naproxen (NAPROSYN) 500 MG tablet, Take 1 tablet (500 mg total) by mouth 2 (two) times daily., Disp: 30 tablet, Rfl: 0   No Known Allergies  Past Medical History:  Diagnosis Date  . Chest pain   . Hx of echocardiogram    Echo 7/14: EF 60-65%, normal WM  . Reflux      History reviewed. No pertinent surgical history.  Family History  Problem Relation Age of Onset  . Healthy Mother   . Healthy Father    . Heart attack Neg Hx   . Sudden death Neg Hx     Social History   Tobacco Use  . Smoking status: Never Smoker  . Smokeless tobacco: Never Used  Vaping Use  . Vaping Use: Never used  Substance Use Topics  . Alcohol use: Yes    Comment: Occassional   . Drug use: No    ROS   Objective:   Vitals: BP 135/76 (BP Location: Left Arm)   Pulse 67   Temp 98.4 F (36.9 C) (Oral)   Resp 18   SpO2 99%   Physical Exam Constitutional:      General: He is not in acute distress.    Appearance: Normal appearance. He is well-developed and normal weight. He is not ill-appearing, toxic-appearing or diaphoretic.  HENT:     Head: Normocephalic and atraumatic.     Right Ear: Tympanic membrane, ear canal and external ear normal. There is no impacted cerumen.     Left Ear: Tympanic membrane, ear canal and external ear normal. There is no impacted cerumen.     Nose: Nose normal. No congestion or rhinorrhea.     Mouth/Throat:     Mouth: Mucous membranes are moist.     Pharynx: No oropharyngeal exudate or posterior oropharyngeal erythema.  Eyes:     General: No scleral icterus.       Right eye: No discharge.  Left eye: No discharge.     Extraocular Movements: Extraocular movements intact.     Conjunctiva/sclera: Conjunctivae normal.     Pupils: Pupils are equal, round, and reactive to light.  Cardiovascular:     Rate and Rhythm: Normal rate and regular rhythm.     Heart sounds: Normal heart sounds. No murmur heard. No friction rub. No gallop.   Pulmonary:     Effort: Pulmonary effort is normal. No respiratory distress.     Breath sounds: Normal breath sounds. No stridor. No wheezing, rhonchi or rales.  Abdominal:     General: Bowel sounds are normal. There is no distension.     Palpations: Abdomen is soft. There is no mass.     Tenderness: There is no abdominal tenderness. There is no right CVA tenderness, left CVA tenderness, guarding or rebound.    Musculoskeletal:      Cervical back: Normal range of motion and neck supple. No rigidity. No muscular tenderness.  Skin:    General: Skin is warm and dry.  Neurological:     General: No focal deficit present.     Mental Status: He is alert and oriented to person, place, and time.     Cranial Nerves: No cranial nerve deficit.     Motor: No weakness.     Coordination: Coordination normal.     Gait: Gait normal.     Deep Tendon Reflexes: Reflexes normal.  Psychiatric:        Mood and Affect: Mood normal.        Behavior: Behavior normal.        Thought Content: Thought content normal.        Judgment: Judgment normal.     Assessment and Plan :   PDMP not reviewed this encounter.  1. Viral upper respiratory tract infection   2. Nasal congestion   3. Sinus headache   4. Generalized abdominal pain     Will manage for viral illness such as viral URI, viral syndrome, COVID-19. Counseled patient on nature of COVID-19 including modes of transmission, diagnostic testing, management and supportive care.  Offered scripts for symptomatic relief. COVID 19 testing is pending.  Suspect superficial abdominal pains musculoskeletal in nature related to his physical exercise.  Recommend supportive care, rest.  Counseled patient on potential for adverse effects with medications prescribed/recommended today, ER and return-to-clinic precautions discussed, patient verbalized understanding.      Wallis Bamberg, PA-C 05/23/20 1210

## 2020-05-23 NOTE — ED Triage Notes (Signed)
Pt c/o nasal congestion x 4 days. Pt states he has had a fever and headaches. He states the fever has went away and states he is now having pain in his stomach. He states he has been feeling weak and states he is unable to keep food down.

## 2020-07-10 ENCOUNTER — Encounter (HOSPITAL_COMMUNITY): Payer: Self-pay

## 2020-07-13 ENCOUNTER — Other Ambulatory Visit: Payer: Self-pay | Admitting: Internal Medicine

## 2020-07-14 LAB — CBC
HCT: 45.1 % (ref 38.5–50.0)
Hemoglobin: 14.7 g/dL (ref 13.2–17.1)
MCH: 26.9 pg — ABNORMAL LOW (ref 27.0–33.0)
MCHC: 32.6 g/dL (ref 32.0–36.0)
MCV: 82.6 fL (ref 80.0–100.0)
MPV: 10.5 fL (ref 7.5–12.5)
Platelets: 222 10*3/uL (ref 140–400)
RBC: 5.46 10*6/uL (ref 4.20–5.80)
RDW: 13.3 % (ref 11.0–15.0)
WBC: 4.9 10*3/uL (ref 3.8–10.8)

## 2020-07-14 LAB — VITAMIN D 25 HYDROXY (VIT D DEFICIENCY, FRACTURES): Vit D, 25-Hydroxy: 35 ng/mL (ref 30–100)

## 2020-07-14 LAB — COMPLETE METABOLIC PANEL WITH GFR
AG Ratio: 2 (calc) (ref 1.0–2.5)
ALT: 15 U/L (ref 9–46)
AST: 22 U/L (ref 10–40)
Albumin: 4.9 g/dL (ref 3.6–5.1)
Alkaline phosphatase (APISO): 46 U/L (ref 36–130)
BUN: 14 mg/dL (ref 7–25)
CO2: 23 mmol/L (ref 20–32)
Calcium: 10.1 mg/dL (ref 8.6–10.3)
Chloride: 104 mmol/L (ref 98–110)
Creat: 1.15 mg/dL (ref 0.60–1.35)
GFR, Est African American: 96 mL/min/{1.73_m2} (ref >=60)
GFR, Est Non African American: 83 mL/min/{1.73_m2} (ref >=60)
Globulin: 2.5 g/dL (calc) (ref 1.9–3.7)
Glucose, Bld: 82 mg/dL (ref 65–99)
Potassium: 4.6 mmol/L (ref 3.5–5.3)
Sodium: 139 mmol/L (ref 135–146)
Total Bilirubin: 0.5 mg/dL (ref 0.2–1.2)
Total Protein: 7.4 g/dL (ref 6.1–8.1)

## 2020-07-14 LAB — LIPID PANEL
Cholesterol: 158 mg/dL (ref <200)
HDL: 57 mg/dL (ref >=40)
LDL Cholesterol (Calc): 86 mg/dL (calc)
Non-HDL Cholesterol (Calc): 101 mg/dL (calc) (ref <130)
Total CHOL/HDL Ratio: 2.8 (calc) (ref <5.0)
Triglycerides: 68 mg/dL (ref <150)

## 2020-07-15 ENCOUNTER — Encounter (HOSPITAL_COMMUNITY): Payer: Self-pay

## 2020-07-15 ENCOUNTER — Other Ambulatory Visit: Payer: Self-pay

## 2020-07-15 ENCOUNTER — Ambulatory Visit (HOSPITAL_COMMUNITY)
Admission: EM | Admit: 2020-07-15 | Discharge: 2020-07-15 | Disposition: A | Discharge status: Home / Self Care | Payer: Medicaid Other | Attending: Emergency Medicine | Admitting: Emergency Medicine

## 2020-07-15 DIAGNOSIS — K219 Gastro-esophageal reflux disease without esophagitis: Secondary | ICD-10-CM | POA: Diagnosis not present

## 2020-07-15 DIAGNOSIS — K5900 Constipation, unspecified: Secondary | ICD-10-CM

## 2020-07-15 MED ORDER — OMEPRAZOLE 20 MG PO CPDR: 20.0000 mg | DELAYED_RELEASE_CAPSULE | Freq: Every day | ORAL | 0 refills | Status: DC

## 2020-07-15 NOTE — ED Triage Notes (Signed)
Pt presents with generalized abdominal pain with nausea since Friday.

## 2020-07-15 NOTE — ED Provider Notes (Signed)
[facility]    CSN: [id number] Arrival date & time: 07/15/20  1252      History   Chief Complaint Chief Complaint  Patient presents with  . Abdominal Pain  . Nausea    HPI Jason Vargas is a 35 y.o. male.   Patient here for evaluation of epigastric abdominal pain that has been going on for the past several weeks to months.  Reports taking Tums which has helped with symptoms but now also complaining of some mild constipation.  Denies any known aggravating factors.  Reports pain as a burning.  Denies any trauma, injury, or other precipitating event. Denies any fevers, chest pain, shortness of breath, N/V/D, numbness, tingling, weakness, or headaches.     The history is provided by the patient.  Abdominal Pain Associated symptoms: constipation   Associated symptoms: no diarrhea, no fever, no nausea and no vomiting     Past Medical History:  Diagnosis Date  . Chest pain   . Hx of echocardiogram    Echo 7/14: EF 60-65%, normal WM  . Reflux     Patient Active Problem List   Diagnosis Date Noted  . Chest pain, unspecified 10/04/2012    History reviewed. No pertinent surgical history.     Home Medications    Prior to Admission medications   Medication Sig Start Date End Date Taking? Authorizing Provider  omeprazole (PRILOSEC) 20 MG capsule Take 1 capsule (20 mg total) by mouth daily. 07/15/20  Yes Ivette Loyal, NP  cephALEXin (KEFLEX) 500 MG capsule Take 1 capsule (500 mg total) by mouth 4 (four) times daily. 07/03/19   Coralyn Mark, NP  cetirizine (ZYRTEC ALLERGY) 10 MG tablet Take 1 tablet (10 mg total) by mouth daily. 05/23/20   Wallis Bamberg, PA-C  cyclobenzaprine (FLEXERIL) 10 MG tablet Take 1 tablet (10 mg total) by mouth 2 (two) times daily as needed for muscle spasms. 06/19/19   [name], NP  fluticasone (FLONASE) 50 MCG/ACT nasal spray Place 2 sprays into both nostrils daily. 05/23/20   Wallis Bamberg, PA-C  ibuprofen (ADVIL) 600 MG  tablet Take 1 tablet (600 mg total) by mouth every 6 (six) hours as needed. 12/13/19   Mickie Bail, NP  meloxicam (MOBIC) 7.5 MG tablet Take 2 tablets (15 mg total) by mouth daily. 06/19/19   [name], NP  naproxen (NAPROSYN) 500 MG tablet Take 1 tablet (500 mg total) by mouth 2 (two) times daily. 07/03/19   Coralyn Mark, NP  pseudoephedrine (SUDAFED) 60 MG tablet Take 1 tablet (60 mg total) by mouth every 8 (eight) hours as needed for congestion. 05/23/20   Wallis Bamberg, PA-C    Family History Family History  Problem Relation Age of Onset  . Healthy Mother   . Healthy Father   . Heart attack Neg Hx   . Sudden death Neg Hx     Social History Social History   Tobacco Use  . Smoking status: Never Smoker  . Smokeless tobacco: Never Used  Vaping Use  . Vaping Use: Never used  Substance Use Topics  . Alcohol use: Yes    Comment: Occassional   . Drug use: No     Allergies   Patient has no known allergies.   Review of Systems Review of Systems  Constitutional: Negative for fever.  Gastrointestinal: Positive for abdominal pain and constipation. Negative for diarrhea, nausea and vomiting.  All other systems reviewed and are negative.    Physical Exam Triage Vital  Signs ED Triage Vitals  Enc Vitals Group     BP 07/15/20 1312 137/79     Pulse Rate 07/15/20 1312 61     Resp 07/15/20 1312 17     Temp 07/15/20 1312 98.7 F (37.1 C)     Temp Source 07/15/20 1312 Oral     SpO2 07/15/20 1312 99 %     Weight --      Height --      Head Circumference --      Peak Flow --      Pain Score 07/15/20 1311 6     Pain Loc --      Pain Edu? --      Excl. in GC? --    No data found.  Updated Vital Signs BP 137/79 (BP Location: Left Arm)   Pulse 61   Temp 98.7 F (37.1 C) (Oral)   Resp 17   SpO2 99%   Visual Acuity Right Eye Distance:   Left Eye Distance:   Bilateral Distance:    Right Eye Near:   Left Eye Near:    Bilateral Near:     Physical  Exam Vitals and nursing note reviewed.  Constitutional:      General: He is not in acute distress.    Appearance: Normal appearance. He is not ill-appearing, toxic-appearing or diaphoretic.  HENT:     Head: Normocephalic and atraumatic.  Eyes:     Conjunctiva/sclera: Conjunctivae normal.  Cardiovascular:     Rate and Rhythm: Normal rate.     Pulses: Normal pulses.     Heart sounds: Normal heart sounds.  Pulmonary:     Effort: Pulmonary effort is normal.     Breath sounds: Normal breath sounds.  Abdominal:     General: Abdomen is flat. Bowel sounds are normal.     Tenderness: There is abdominal tenderness in the epigastric area. There is no right CVA tenderness, left CVA tenderness, guarding or rebound. Negative signs include Murphy's sign, Rovsing's sign, McBurney's sign, psoas sign and obturator sign.     Hernia: No hernia is present.  Musculoskeletal:        General: Normal range of motion.     Cervical back: Normal range of motion.  Skin:    General: Skin is warm and dry.  Neurological:     General: No focal deficit present.     Mental Status: He is alert and oriented to person, place, and time.  Psychiatric:        Mood and Affect: Mood normal.      UC Treatments / Results  Labs (all labs ordered are listed, but only abnormal results are displayed) Labs Reviewed - No data to display  EKG   Radiology No results found.  Procedures Procedures (including critical care time)  Medications Ordered in UC Medications - No data to display  Initial Impression / Assessment and Plan / UC Course  I have reviewed the triage vital signs and the nursing notes.  Pertinent labs & imaging results that were available during my care of the patient were reviewed by me and considered in my medical decision making (see chart for details).     Assessment negative for red flags or concerns.  Likely GERD with mild constipation.  Prescribed omeprazole daily.  Encourage increased fluid  intake.  Instructed patient to avoid laying down immediately after eating.  Try to avoid acidic or spicy foods that can trigger symptoms.  Patient may take MiraLAX daily until he  has a good bowel movement.  Again encouraged fluid intake.  Recommend increasing the amount of fiber in his diet.  Follow-up with primary care appointment as scheduled in 3 weeks. Final Clinical Impressions(s) / UC Diagnoses   Final diagnoses:  Gastroesophageal reflux disease without esophagitis  Constipation, unspecified constipation type     Discharge Instructions     Take the omeprazole daily to help with acid reflex/GERD.   Avoid eating highly acidic foods or spicy foods.    Drink plenty of water.  Avoid laying flat immediately after eating.    Take MiraLAX daily until you have a good bowel movement.  It may take a few days.    Return or go to the Emergency Department if symptoms worsen or do not improve in the next few days.      ED Prescriptions    Medication Sig Dispense Auth. Provider   omeprazole (PRILOSEC) 20 MG capsule Take 1 capsule (20 mg total) by mouth daily. 30 capsule Ivette Loyal, NP     PDMP not reviewed this encounter.   Ivette Loyal, NP 07/15/20 1340

## 2020-07-15 NOTE — Discharge Instructions (Signed)
Take the omeprazole daily to help with acid reflex/GERD.   Avoid eating highly acidic foods or spicy foods.    Drink plenty of water.  Avoid laying flat immediately after eating.    Take MiraLAX daily until you have a good bowel movement.  It may take a few days.    Return or go to the Emergency Department if symptoms worsen or do not improve in the next few days.

## 2020-07-26 NOTE — Progress Notes (Signed)
   Covid-19 Vaccination Clinic  Name:  Beth Spackman    MRN: [id number] DOB: 1985-05-14  07/26/2020  Mr. Ernie Avena was observed post Covid-19 immunization for 15 minutes without incident. He was provided with Vaccine Information Sheet and instruction to access the V-Safe system.   Mr. Ernie Avena was instructed to call 911 with any severe reactions post vaccine: Marland Kitchen Difficulty breathing  . Swelling of face and throat  . A fast heartbeat  . A bad rash all over body  . Dizziness and weakness   Immunizations Administered    Name Date Dose VIS Date Route   Pfizer COVID-19 Vaccine 05/04/2020 10:00 AM 0.3 mL 12/21/2019 Intramuscular   Manufacturer: [employer], [employer]   Lot: [id number]   NDC: [address]-6

## 2020-08-02 NOTE — Progress Notes (Signed)
   Covid-19 Vaccination Clinic  Name:  Jason Vargas    MRN: [id number] DOB: 1985-04-23  08/02/2020  Mr. Jason Vargas was observed post Covid-19 immunization for 15 minutes without incident. He was provided with Vaccine Information Sheet and instruction to access the V-Safe system.   Mr. Jason Vargas was instructed to call 911 with any severe reactions post vaccine: Marland Kitchen Difficulty breathing  . Swelling of face and throat  . A fast heartbeat  . A bad rash all over body  . Dizziness and weakness   Immunizations Administered    Name Date Dose VIS Date Route   Pfizer COVID-19 Vaccine 04/13/2020  2:30 PM 0.3 mL 12/21/2019 Intramuscular   Manufacturer: [employer], [employer]   Lot: [id number]   NDC: [address]-4

## 2021-02-15 ENCOUNTER — Ambulatory Visit (HOSPITAL_COMMUNITY)
Admission: EM | Admit: 2021-02-15 | Discharge: 2021-02-15 | Disposition: A | Discharge status: Home / Self Care | Payer: Medicaid Other | Attending: Physician Assistant | Admitting: Physician Assistant

## 2021-02-15 ENCOUNTER — Encounter (HOSPITAL_COMMUNITY): Payer: Self-pay

## 2021-02-15 ENCOUNTER — Other Ambulatory Visit: Payer: Self-pay

## 2021-02-15 DIAGNOSIS — J0140 Acute pansinusitis, unspecified: Secondary | ICD-10-CM

## 2021-02-15 MED ORDER — AMOXICILLIN-POT CLAVULANATE 875-125 MG PO TABS: 1.0000 | ORAL_TABLET | Freq: Two times a day (BID) | ORAL | 0 refills | Status: AC

## 2021-02-15 NOTE — ED Triage Notes (Signed)
Pt presented to the office for body aches, fever congestion and cough x 2 weeks

## 2021-02-15 NOTE — ED Provider Notes (Signed)
[place] [facility]    CSN: [id number] Arrival date & time: 02/15/21  1709      History   Chief Complaint Chief Complaint  Patient presents with   Fever   Generalized Body Aches   Sore Throat    HPI Jason Vargas is a 35 y.o. male.   Patient presents today with a 2-week history of sinus symptoms.  Reports scratchy throat, sinus pressure, fever, body aches.  He describes body aches as a burning sensation over his knees and other major joints.  He initially thought this was related to going to the gym but is not responding to ibuprofen any further.  Denies any known sick contacts.  Does report he had a leftover prescription for an antibiotic and took a few doses of this which provided improvement but not resolution of symptoms.  He denies history of allergies, asthma, diabetes.  He does not smoke.  He has had COVID-vaccine.  Has not had COVID in the past.  He is having difficulty with daily duties as he feels miserable in the evening.   Past Medical History:  Diagnosis Date   Chest pain    Hx of echocardiogram    Echo 7/14: EF 60-65%, normal WM   Reflux     Patient Active Problem List   Diagnosis Date Noted   Chest pain, unspecified 10/04/2012    History reviewed. No pertinent surgical history.     Home Medications    Prior to Admission medications   Medication Sig Start Date End Date Taking? Authorizing Provider  amoxicillin-clavulanate (AUGMENTIN) 875-125 MG tablet Take 1 tablet by mouth every 12 (twelve) hours for 10 days. 02/15/21 02/25/21 Yes Darian Ace, Derry Skill, PA-C  cetirizine (ZYRTEC ALLERGY) 10 MG tablet Take 1 tablet (10 mg total) by mouth daily. 05/23/20   Jaynee Eagles, PA-C  cyclobenzaprine (FLEXERIL) 10 MG tablet Take 1 tablet (10 mg total) by mouth 2 (two) times daily as needed for muscle spasms. 06/19/19   Faustino Congress, NP  fluticasone (FLONASE) 50 MCG/ACT nasal spray Place 2 sprays into both nostrils daily. 05/23/20   Jaynee Eagles, PA-C  ibuprofen  (ADVIL) 600 MG tablet Take 1 tablet (600 mg total) by mouth every 6 (six) hours as needed. 12/13/19   Sharion Balloon, NP  meloxicam (MOBIC) 7.5 MG tablet Take 2 tablets (15 mg total) by mouth daily. 06/19/19   Faustino Congress, NP  naproxen (NAPROSYN) 500 MG tablet Take 1 tablet (500 mg total) by mouth 2 (two) times daily. 07/03/19   Marney Setting, NP  omeprazole (PRILOSEC) 20 MG capsule Take 1 capsule (20 mg total) by mouth daily. 07/15/20   Pearson Forster, NP    Family History Family History  Problem Relation Age of Onset   Healthy Mother    Healthy Father    Heart attack Neg Hx    Sudden death Neg Hx     Social History Social History   Tobacco Use   Smoking status: Never   Smokeless tobacco: Never  Vaping Use   Vaping Use: Never used  Substance Use Topics   Alcohol use: Yes    Comment: Occassional    Drug use: No     Allergies   Patient has no known allergies.   Review of Systems Review of Systems  Constitutional:  Positive for activity change, chills, fatigue and fever (subjective). Negative for appetite change.  HENT:  Positive for congestion, postnasal drip, sinus pressure and sore throat. Negative for sneezing.   Respiratory:  Negative for cough and shortness of breath.   Cardiovascular:  Negative for chest pain.  Gastrointestinal:  Negative for abdominal pain, diarrhea, nausea and vomiting.  Musculoskeletal:  Positive for arthralgias and myalgias.  Neurological:  Positive for headaches. Negative for dizziness and light-headedness.    Physical Exam Triage Vital Signs ED Triage Vitals [02/15/21 1736]  Enc Vitals Group     BP (!) 150/84     Pulse Rate 84     Resp 18     Temp 98 F (36.7 C)     Temp Source Oral     SpO2 100 %     Weight      Height      Head Circumference      Peak Flow      Pain Score      Pain Loc      Pain Edu?      Excl. in GC?    No data found.  Updated Vital Signs BP (!) 150/84 (BP Location: Left Arm)    Pulse 84     Temp 98 F (36.7 C) (Oral)    Resp 18    SpO2 100%   Visual Acuity Right Eye Distance:   Left Eye Distance:   Bilateral Distance:    Right Eye Near:   Left Eye Near:    Bilateral Near:     Physical Exam Vitals reviewed.  Constitutional:      General: He is awake.     Appearance: Normal appearance. He is well-developed. He is not ill-appearing.     Comments: Very pleasant male appears stated age in no acute distress sitting comfortably in exam room  HENT:     Head: Normocephalic and atraumatic.     Right Ear: Tympanic membrane, ear canal and external ear normal. Tympanic membrane is not erythematous or bulging.     Left Ear: Tympanic membrane, ear canal and external ear normal. Tympanic membrane is not erythematous or bulging.     Nose:     Right Sinus: Maxillary sinus tenderness and frontal sinus tenderness present.     Left Sinus: Maxillary sinus tenderness and frontal sinus tenderness present.     Mouth/Throat:     Pharynx: Uvula midline. Posterior oropharyngeal erythema present. No oropharyngeal exudate.     Comments: Erythema and drainage in posterior oropharynx Cardiovascular:     Rate and Rhythm: Normal rate and regular rhythm.     Heart sounds: Normal heart sounds, S1 normal and S2 normal. No murmur heard. Pulmonary:     Effort: Pulmonary effort is normal. No accessory muscle usage or respiratory distress.     Breath sounds: Normal breath sounds. No stridor. No wheezing, rhonchi or rales.     Comments: Clear to auscultation bilaterally Abdominal:     General: Bowel sounds are normal.     Palpations: Abdomen is soft.     Tenderness: There is no abdominal tenderness.  Neurological:     Mental Status: He is alert.  Psychiatric:        Behavior: Behavior is cooperative.     UC Treatments / Results  Labs (all labs ordered are listed, but only abnormal results are displayed) Labs Reviewed - No data to display  EKG   Radiology No results  found.  Procedures Procedures (including critical care time)  Medications Ordered in UC Medications - No data to display  Initial Impression / Assessment and Plan / UC Course  I have reviewed the triage vital signs and the  nursing notes.  Pertinent labs & imaging results that were available during my care of the patient were reviewed by me and considered in my medical decision making (see chart for details).     No indication for viral testing given patient has been symptomatic for 2 weeks.  Will cover for sinusitis given prolonged and worsening symptoms.  He was given Augmentin 875/125 twice daily for 10 days.  Recommend to use over-the-counter medications including Flonase for symptom relief.  He can alternate Tylenol ibuprofen for fever and pain.  He is to rest and drink plenty of fluid.  Discussed that if symptoms are not improving he needs to follow-up for reevaluation.  Discussed alarm symptoms that warrant emergent evaluation including high fever, chest discomfort, shortness of breath, body aches.  Strict return precautions given to which she expressed understanding.  Final Clinical Impressions(s) / UC Diagnoses   Final diagnoses:  Acute non-recurrent pansinusitis     Discharge Instructions      Take Augmentin twice a day.  This is antibiotic that will cover for sinus infection.  Alternate Tylenol ibuprofen for pain.  Use Flonase for nasal congestion.  Make sure you rest and drink plenty of fluid.  If your symptoms not improving please return for reevaluation.  If anything worsens and you develop chest pain, high fever not responding to medication, shortness of breath, nausea/vomiting interfering with oral intake, weakness you need to go to the emergency room.     ED Prescriptions     Medication Sig Dispense Auth. Provider   amoxicillin-clavulanate (AUGMENTIN) 875-125 MG tablet Take 1 tablet by mouth every 12 (twelve) hours for 10 days. 20 tablet Ilaisaane Marts, Derry Skill, PA-C       PDMP not reviewed this encounter.   Terrilee Croak, PA-C 02/15/21 1758

## 2021-02-15 NOTE — Discharge Instructions (Signed)
Take Augmentin twice a day.  This is antibiotic that will cover for sinus infection.  Alternate Tylenol ibuprofen for pain.  Use Flonase for nasal congestion.  Make sure you rest and drink plenty of fluid.  If your symptoms not improving please return for reevaluation.  If anything worsens and you develop chest pain, high fever not responding to medication, shortness of breath, nausea/vomiting interfering with oral intake, weakness you need to go to the emergency room.

## 2021-04-11 ENCOUNTER — Ambulatory Visit (HOSPITAL_COMMUNITY)
Admission: EM | Admit: 2021-04-11 | Discharge: 2021-04-11 | Disposition: A | Discharge status: Home / Self Care | Payer: Medicaid Other | Attending: Family Medicine | Admitting: Family Medicine

## 2021-04-11 ENCOUNTER — Encounter (HOSPITAL_COMMUNITY): Payer: Self-pay

## 2021-04-11 ENCOUNTER — Other Ambulatory Visit: Payer: Self-pay

## 2021-04-11 DIAGNOSIS — S86811A Strain of other muscle(s) and tendon(s) at lower leg level, right leg, initial encounter: Secondary | ICD-10-CM

## 2021-04-11 DIAGNOSIS — M25511 Pain in right shoulder: Secondary | ICD-10-CM | POA: Diagnosis not present

## 2021-04-11 MED ORDER — CYCLOBENZAPRINE HCL 10 MG PO TABS: 10.0000 mg | ORAL_TABLET | Freq: Two times a day (BID) | ORAL | 0 refills | Status: DC | PRN

## 2021-04-11 NOTE — ED Triage Notes (Signed)
Pt presents with right shoulder pain and right calf pain X 1 week. Pt states he is training in the gym for an event.

## 2021-04-11 NOTE — Discharge Instructions (Addendum)
You were seen today for calf muscle pain, as well as shoulder pain.   For your calf muscle pain, I think this is a muscle strain.  This can take several weeks to heal, or more.  Please rest, use ice, and elevate the leg.  I have sent out a muscle relaxer you may trial as well.  If not improving, then please follow up with the Sport Medicine clinic at [phone]. You may use motrin for your calf and shoulder pain as well.

## 2021-04-11 NOTE — ED Provider Notes (Signed)
[place]    CSN: [id number] Arrival date & time: 04/11/21  0844      History   Chief Complaint Chief Complaint  Patient presents with   Leg Pain   Shoulder Pain    HPI Jason Vargas is a 36 y.o. male.   Patient is here for right shoulder pain.  Started about 1 week ago.  He is going to the gym 3-4 days/week, training for the [employer].  He usually does push ups, lifting free weights.   Pain mostly with doing push ups, but that does not bother him so much.  He mostly has pain into the right calf.  He was doing stretches, and then started running, and noted cramping into the right calf.  He ran through it, but had severe pain he had to stop.  He took a week off, went back yesterday, and had severe pain again.  Feels like the muscle is tearing. He used an ice pack without much help.   Past Medical History:  Diagnosis Date   Chest pain    Hx of echocardiogram    Echo 7/14: EF 60-65%, normal WM   Reflux     Patient Active Problem List   Diagnosis Date Noted   Chest pain, unspecified 10/04/2012    History reviewed. No pertinent surgical history.     Home Medications    Prior to Admission medications   Medication Sig Start Date End Date Taking? Authorizing Provider  cetirizine (ZYRTEC ALLERGY) 10 MG tablet Take 1 tablet (10 mg total) by mouth daily. 05/23/20   Jaynee Eagles, PA-C  cyclobenzaprine (FLEXERIL) 10 MG tablet Take 1 tablet (10 mg total) by mouth 2 (two) times daily as needed for muscle spasms. 06/19/19   Faustino Congress, NP  fluticasone (FLONASE) 50 MCG/ACT nasal spray Place 2 sprays into both nostrils daily. 05/23/20   Jaynee Eagles, PA-C  ibuprofen (ADVIL) 600 MG tablet Take 1 tablet (600 mg total) by mouth every 6 (six) hours as needed. 12/13/19   Sharion Balloon, NP  meloxicam (MOBIC) 7.5 MG tablet Take 2 tablets (15 mg total) by mouth daily. 06/19/19   Faustino Congress, NP  naproxen (NAPROSYN) 500 MG tablet Take 1 tablet (500 mg total) by mouth 2 (two)  times daily. 07/03/19   Marney Setting, NP  omeprazole (PRILOSEC) 20 MG capsule Take 1 capsule (20 mg total) by mouth daily. 07/15/20   Pearson Forster, NP    Family History Family History  Problem Relation Age of Onset   Healthy Mother    Healthy Father    Heart attack Neg Hx    Sudden death Neg Hx     Social History Social History   Tobacco Use   Smoking status: Never   Smokeless tobacco: Never  Vaping Use   Vaping Use: Never used  Substance Use Topics   Alcohol use: Yes    Comment: Occassional    Drug use: No     Allergies   Patient has no known allergies.   Review of Systems Review of Systems  Constitutional: Negative.   HENT: Negative.    Respiratory: Negative.    Cardiovascular: Negative.   Musculoskeletal:  Positive for arthralgias and myalgias. Negative for joint swelling.    Physical Exam Triage Vital Signs ED Triage Vitals  Enc Vitals Group     BP 04/11/21 0917 128/79     Pulse Rate 04/11/21 0917 62     Resp 04/11/21 0917 18  Temp 04/11/21 0917 98.3 F (36.8 C)     Temp Source 04/11/21 0917 Oral     SpO2 04/11/21 0917 98 %     Weight --      Height --      Head Circumference --      Peak Flow --      Pain Score 04/11/21 0921 7     Pain Loc --      Pain Edu? --      Excl. in [place]? --    No data found.  Updated Vital Signs BP 128/79 (BP Location: Right Arm)    Pulse 62    Temp 98.3 F (36.8 C) (Oral)    Resp 18    SpO2 98%   Visual Acuity Right Eye Distance:   Left Eye Distance:   Bilateral Distance:    Right Eye Near:   Left Eye Near:    Bilateral Near:     Physical Exam Constitutional:      Appearance: Normal appearance.  Cardiovascular:     Rate and Rhythm: Normal rate and regular rhythm.  Pulmonary:     Effort: Pulmonary effort is normal.     Breath sounds: Normal breath sounds.  Musculoskeletal:     Right shoulder: Tenderness present. No swelling or deformity. Normal range of motion.     Right lower leg: Tenderness  present. No swelling. No edema.     Comments: At the right shoulder there is TTP at the upper trapezius;    TTP at the right mid calf;  no swelling, warmth or selling noted;  pain with full dorsi-flexion of the foot  Neurological:     Mental Status: He is alert.     UC Treatments / Results  Labs (all labs ordered are listed, but only abnormal results are displayed) Labs Reviewed - No data to display  EKG   Radiology No results found.  Procedures Procedures (including critical care time)  Medications Ordered in UC Medications - No data to display  Initial Impression / Assessment and Plan / UC Course  I have reviewed the triage vital signs and the nursing notes.  Pertinent labs & imaging results that were available during my care of the patient were reviewed by me and considered in my medical decision making (see chart for details).    Final Clinical Impressions(s) / UC Diagnoses   Final diagnoses:  Acute pain of right shoulder  Strain of calf muscle, right, initial encounter     Discharge Instructions      You were seen today for calf muscle pain, as well as shoulder pain.   For your calf muscle pain, I think this is a muscle strain.  This can take several weeks to heal, or more.  Please rest, use ice, and elevate the leg.  I have sent out a muscle relaxer you may trial as well.  If not improving, then please follow up with the Sport Medicine clinic at [phone]. You may use motrin for your calf and shoulder pain as well.      ED Prescriptions     Medication Sig Dispense Auth. Provider   cyclobenzaprine (FLEXERIL) 10 MG tablet Take 1 tablet (10 mg total) by mouth 2 (two) times daily as needed for muscle spasms. 20 tablet Rondel Oh, MD      PDMP not reviewed this encounter.   Rondel Oh, MD 04/11/21 1000

## 2021-11-17 ENCOUNTER — Ambulatory Visit (HOSPITAL_COMMUNITY)
Admission: EM | Admit: 2021-11-17 | Discharge: 2021-11-17 | Disposition: A | Discharge status: Home / Self Care | Payer: 59 | Attending: Emergency Medicine | Admitting: Emergency Medicine

## 2021-11-17 ENCOUNTER — Encounter (HOSPITAL_COMMUNITY): Payer: Self-pay

## 2021-11-17 DIAGNOSIS — M25512 Pain in left shoulder: Secondary | ICD-10-CM | POA: Diagnosis not present

## 2021-11-17 DIAGNOSIS — S39011A Strain of muscle, fascia and tendon of abdomen, initial encounter: Secondary | ICD-10-CM

## 2021-11-17 MED ORDER — CYCLOBENZAPRINE HCL 10 MG PO TABS: 10.0000 mg | ORAL_TABLET | Freq: Every day | ORAL | 0 refills | Status: DC

## 2021-11-17 MED ORDER — KETOROLAC TROMETHAMINE 30 MG/ML IJ SOLN
30.0000 mg | Freq: Once | INTRAMUSCULAR | Status: AC
  Administered 2021-11-17: 30 mg via INTRAMUSCULAR

## 2021-11-17 MED ORDER — PREDNISONE 20 MG PO TABS: 40.0000 mg | ORAL_TABLET | Freq: Every day | ORAL | 0 refills | Status: DC

## 2021-11-17 MED ORDER — KETOROLAC TROMETHAMINE 30 MG/ML IJ SOLN
INTRAMUSCULAR | Status: AC
  Filled 2021-11-17: qty 1

## 2021-11-17 NOTE — ED Triage Notes (Signed)
Onset 3 weeks stomach pain and left shoulder pain.  Patient having epigastric abdominal pain. Patient states unable to do abb workouts.   Reduced range of motion in the left shoulder. No new work outs, falls, or known injuries.

## 2021-11-17 NOTE — Discharge Instructions (Signed)
Your pain is most likely caused by irritation to the muscles.  You have been given an injection of Toradol today to help reduce the inflammatory process that occurs with injury which ideally will minimize your pain  Starting tomorrow you may take prednisone every morning with food to continue the above process, while using steroids you may take Tylenol throughout the day as needed for pain  You may use Flexeril at bedtime as needed for additional comfort, be mindful this medication may make you drowsy  You may use heating pad in 15 minute intervals as needed for additional comfort, or  you may find comfort in using ice in 10-15 minutes over affected area  Begin stretching affected area daily for 10 minutes as tolerated to further loosen muscles   When lying down place pillow underneath arm and behind back  If pain persist after recommended treatment or reoccurs if may be beneficial to follow up with orthopedic specialist for evaluation, this doctor specializes in the bones and can manage your symptoms long-term with options such as but not limited to imaging, medications or physical therapy

## 2021-11-17 NOTE — ED Provider Notes (Signed)
[facility]    CSN: [id number] Arrival date & time: 11/17/21  1122      History   Chief Complaint Chief Complaint  Patient presents with   Shoulder Pain   Abdominal Pain    HPI Jason Vargas is a 36 y.o. male.   Patient presents with constant left lower abdominal pain for 3 weeks.  Endorses pain becomes more severe only when working out.  Symptoms began after he completed crutches, endorses that he is in the [employer] and they have days where they only do crunches and push-ups.  Pain does not radiate from the left lower quadrant.  Patient believes pain is external, does not feel like an internal process.  Denies nausea, vomiting, diarrhea, fever, chills, bloating, heartburn or indigestion.  Patient also concerned with superior left shoulder pain that has been present for 3 weeks.  Pain can be felt when lifting arm above head initiating a pulling sensation.  Denies numbness or tingling . has attempted use of 200 mg of ibuprofen and rest which was ineffective.    Past Medical History:  Diagnosis Date   Chest pain    Hx of echocardiogram    Echo 7/14: EF 60-65%, normal WM   Reflux     Patient Active Problem List   Diagnosis Date Noted   Chest pain, unspecified 10/04/2012    History reviewed. No pertinent surgical history.     Home Medications    Prior to Admission medications   Medication Sig Start Date End Date Taking? Authorizing Provider  cetirizine (ZYRTEC ALLERGY) 10 MG tablet Take 1 tablet (10 mg total) by mouth daily. 05/23/20   Jaynee Eagles, PA-C  cyclobenzaprine (FLEXERIL) 10 MG tablet Take 1 tablet (10 mg total) by mouth 2 (two) times daily as needed for muscle spasms. 04/11/21   Piontek, Junie Panning, MD  fluticasone (FLONASE) 50 MCG/ACT nasal spray Place 2 sprays into both nostrils daily. 05/23/20   Jaynee Eagles, PA-C  ibuprofen (ADVIL) 600 MG tablet Take 1 tablet (600 mg total) by mouth every 6 (six) hours as needed. 12/13/19   Sharion Balloon, NP  meloxicam (MOBIC)  7.5 MG tablet Take 2 tablets (15 mg total) by mouth daily. 06/19/19   Faustino Congress, NP  naproxen (NAPROSYN) 500 MG tablet Take 1 tablet (500 mg total) by mouth 2 (two) times daily. 07/03/19   Marney Setting, NP  omeprazole (PRILOSEC) 20 MG capsule Take 1 capsule (20 mg total) by mouth daily. 07/15/20   Pearson Forster, NP    Family History Family History  Problem Relation Age of Onset   Healthy Mother    Healthy Father    Heart attack Neg Hx    Sudden death Neg Hx     Social History Social History   Tobacco Use   Smoking status: Never   Smokeless tobacco: Never  Vaping Use   Vaping Use: Never used  Substance Use Topics   Alcohol use: Yes    Comment: Occassional    Drug use: No     Allergies   Patient has no known allergies.   Review of Systems Review of Systems  Constitutional: Negative.   Respiratory: Negative.    Cardiovascular: Negative.   Gastrointestinal:  Positive for abdominal pain. Negative for abdominal distention, anal bleeding, blood in stool, constipation, diarrhea, nausea, rectal pain and vomiting.  Musculoskeletal:  Positive for myalgias. Negative for arthralgias, back pain, gait problem, joint swelling, neck pain and neck stiffness.  Skin: Negative.   Neurological:  Negative.      Physical Exam Triage Vital Signs ED Triage Vitals  Enc Vitals Group     BP 11/17/21 1233 (!) 141/77     Pulse Rate 11/17/21 1233 (!) 58     Resp 11/17/21 1233 16     Temp 11/17/21 1233 98.3 F (36.8 C)     Temp Source 11/17/21 1233 Oral     SpO2 11/17/21 1233 100 %     Weight --      Height --      Head Circumference --      Peak Flow --      Pain Score 11/17/21 1235 8     Pain Loc --      Pain Edu? --      Excl. in [place]? --    No data found.  Updated Vital Signs BP (!) 141/77 (BP Location: Left Arm)   Pulse (!) 58   Temp 98.3 F (36.8 C) (Oral)   Resp 16   SpO2 100%   Visual Acuity Right Eye Distance:   Left Eye Distance:   Bilateral  Distance:    Right Eye Near:   Left Eye Near:    Bilateral Near:     Physical Exam Constitutional:      Appearance: Normal appearance.  Eyes:     Extraocular Movements: Extraocular movements intact.  Pulmonary:     Effort: Pulmonary effort is normal.  Abdominal:     General: Abdomen is flat. Bowel sounds are normal.     Palpations: Abdomen is soft.  Musculoskeletal:     Comments: Point tenderness is present at the site where the clavicle and shoulder joint meet, no ecchymosis, swelling or deformity, range of motion of the shoulder is intact, no tenderness is present along the lateral aspect of the neck, 2+ carotid and brachial pulse, strength is a 5 out of 5, negative Hawkins sign  Skin:         Comments: Tenderness is adjacent to the periumbilical region in the left lower quadrant, pain is worsened with flexion of the muscles  Neurological:     Mental Status: He is alert and oriented to person, place, and time. Mental status is at baseline.  Psychiatric:        Mood and Affect: Mood normal.        Behavior: Behavior normal.      UC Treatments / Results  Labs (all labs ordered are listed, but only abnormal results are displayed) Labs Reviewed - No data to display  EKG   Radiology No results found.  Procedures Procedures (including critical care time)  Medications Ordered in UC Medications - No data to display  Initial Impression / Assessment and Plan / UC Course  I have reviewed the triage vital signs and the nursing notes.  Pertinent labs & imaging results that were available during my care of the patient were reviewed by me and considered in my medical decision making (see chart for details).  Acute left shoulder pain, strain of the abdominal muscle, initial encounter  Etiology appears to be muscular most likely related to physical activity, low suspicion for an acute abdomen based on timeline and symptomology discussed with patient, Toradol injection given in  office and prescribed prednisone and Flexeril for outpatient use, recommended stretching, massage, ice and heat over the affected area and continued activity as tolerated, given walker referral to orthopedics if symptoms persist or worsen Final Clinical Impressions(s) / UC Diagnoses   Final diagnoses:  None   Discharge Instructions   None    ED Prescriptions   None    PDMP not reviewed this encounter.   Valinda Hoar, NP 11/17/21 1329

## 2022-04-11 ENCOUNTER — Ambulatory Visit (HOSPITAL_COMMUNITY)
Admission: EM | Admit: 2022-04-11 | Discharge: 2022-04-11 | Disposition: A | Discharge status: Home / Self Care | Payer: 59 | Attending: Physician Assistant | Admitting: Physician Assistant

## 2022-04-11 ENCOUNTER — Encounter (HOSPITAL_COMMUNITY): Payer: Self-pay

## 2022-04-11 DIAGNOSIS — M25562 Pain in left knee: Secondary | ICD-10-CM | POA: Diagnosis not present

## 2022-04-11 DIAGNOSIS — J069 Acute upper respiratory infection, unspecified: Secondary | ICD-10-CM

## 2022-04-11 MED ORDER — PREDNISONE 20 MG PO TABS: 40.0000 mg | ORAL_TABLET | Freq: Every day | ORAL | 0 refills | Status: AC

## 2022-04-11 NOTE — ED Provider Notes (Signed)
[place]    CSN: [id number] Arrival date & time: 04/11/22  [id number]      History   Chief Complaint Chief Complaint  Patient presents with   Headache    HPI Jason Vargas is a 37 y.o. male.   Patient here today for evaluation of headache, dizziness, chills that he has had since last Friday after getting flu shot.  He denies any vomiting or diarrhea. He has taken at home covid test that was negative.  He also reports pain in his left knee.  He states this is not related to other upper respiratory symptoms.  He has taken Tylenol for his knee.  He denies any known injury but does state that he runs a lot.  He notes that pain is located to the medial posterior aspect of his left knee and seems to occur after he has been running for about 20 minutes.  He denies any numbness or tingling.  The history is provided by the patient.    Past Medical History:  Diagnosis Date   Chest pain    Hx of echocardiogram    Echo 7/14: EF 60-65%, normal WM   Reflux     Patient Active Problem List   Diagnosis Date Noted   Chest pain, unspecified 10/04/2012    History reviewed. No pertinent surgical history.     Home Medications    Prior to Admission medications   Medication Sig Start Date End Date Taking? Authorizing Provider  predniSONE (DELTASONE) 20 MG tablet Take 2 tablets (40 mg total) by mouth daily with breakfast for 5 days. 04/11/22 04/16/22 Yes Francene Finders, PA-C    Family History Family History  Problem Relation Age of Onset   Healthy Mother    Healthy Father    Heart attack Neg Hx    Sudden death Neg Hx     Social History Social History   Tobacco Use   Smoking status: Never   Smokeless tobacco: Never  Vaping Use   Vaping Use: Never used  Substance Use Topics   Alcohol use: Yes    Comment: Occassional    Drug use: No     Allergies   Patient has no known allergies.   Review of Systems Review of Systems  Constitutional:  Positive for chills.  Negative for fever.  HENT:  Positive for congestion. Negative for sore throat.   Eyes:  Negative for discharge and redness.  Respiratory:  Negative for cough and shortness of breath.   Musculoskeletal:  Positive for arthralgias. Negative for joint swelling.  Neurological:  Positive for dizziness and headaches. Negative for numbness.     Physical Exam Triage Vital Signs ED Triage Vitals  Enc Vitals Group     BP      Pulse      Resp      Temp      Temp src      SpO2      Weight      Height      Head Circumference      Peak Flow      Pain Score      Pain Loc      Pain Edu?      Excl. in [place]?    No data found.  Updated Vital Signs BP 138/82 (BP Location: Right Arm)   Pulse 72   Temp 98.4 F (36.9 C) (Oral)   Resp 18   SpO2 97%      Physical Exam Vitals  and nursing note reviewed.  Constitutional:      General: He is not in acute distress.    Appearance: Normal appearance. He is not ill-appearing.  HENT:     Head: Normocephalic and atraumatic.     Nose: Congestion (mild) present.  Eyes:     Conjunctiva/sclera: Conjunctivae normal.  Cardiovascular:     Rate and Rhythm: Normal rate and regular rhythm.  Pulmonary:     Effort: Pulmonary effort is normal. No respiratory distress.     Breath sounds: Normal breath sounds. No wheezing, rhonchi or rales.  Musculoskeletal:     Comments: Full ROM of left knee, no TTP appreciated diffusely to left knee  Neurological:     Mental Status: He is alert.  Psychiatric:        Mood and Affect: Mood normal.        Behavior: Behavior normal.        Thought Content: Thought content normal.      UC Treatments / Results  Labs (all labs ordered are listed, but only abnormal results are displayed) Labs Reviewed - No data to display  EKG   Radiology No results found.  Procedures Procedures (including critical care time)  Medications Ordered in UC Medications - No data to display  Initial Impression / Assessment and  Plan / UC Course  I have reviewed the triage vital signs and the nursing notes.  Pertinent labs & imaging results that were available during my care of the patient were reviewed by me and considered in my medical decision making (see chart for details).    Discussed that headaches, dizziness, other upper respiratory symptoms may be related to viral etiology.  Recommended continued symptomatic treatment and rest.  Will trial steroid burst for knee pain and recommended follow-up with Ortho if no gradual improvement.  Patient expressed understanding.  Final Clinical Impressions(s) / UC Diagnoses   Final diagnoses:  Acute upper respiratory infection  Acute pain of left knee     Discharge Instructions       Please follow up with ortho if knee pain persists.        ED Prescriptions     Medication Sig Dispense Auth. Provider   predniSONE (DELTASONE) 20 MG tablet Take 2 tablets (40 mg total) by mouth daily with breakfast for 5 days. 10 tablet Francene Finders, PA-C      PDMP not reviewed this encounter.   Francene Finders, PA-C 04/11/22 1231

## 2022-04-11 NOTE — Discharge Instructions (Addendum)
  Please follow up with ortho if knee pain persists.

## 2022-04-11 NOTE — ED Triage Notes (Signed)
Pt states got his flu shot last Friday and has been having a headache, dizziness, hot/cold chills, and having pain to lt knee. States taking tylenol with relief. States had a neg home covid test on Wednesday.

## 2022-04-27 IMAGING — DX DG WRIST COMPLETE 3+V*L*
4 series · 4 of 4 positions shown · non-contrast
Comparison: None.

CLINICAL DATA: Fall 1 week ago with persistent wrist pain, initial
encounter

EXAM:
LEFT WRIST - COMPLETE 3+ VIEW

[wrist pa]
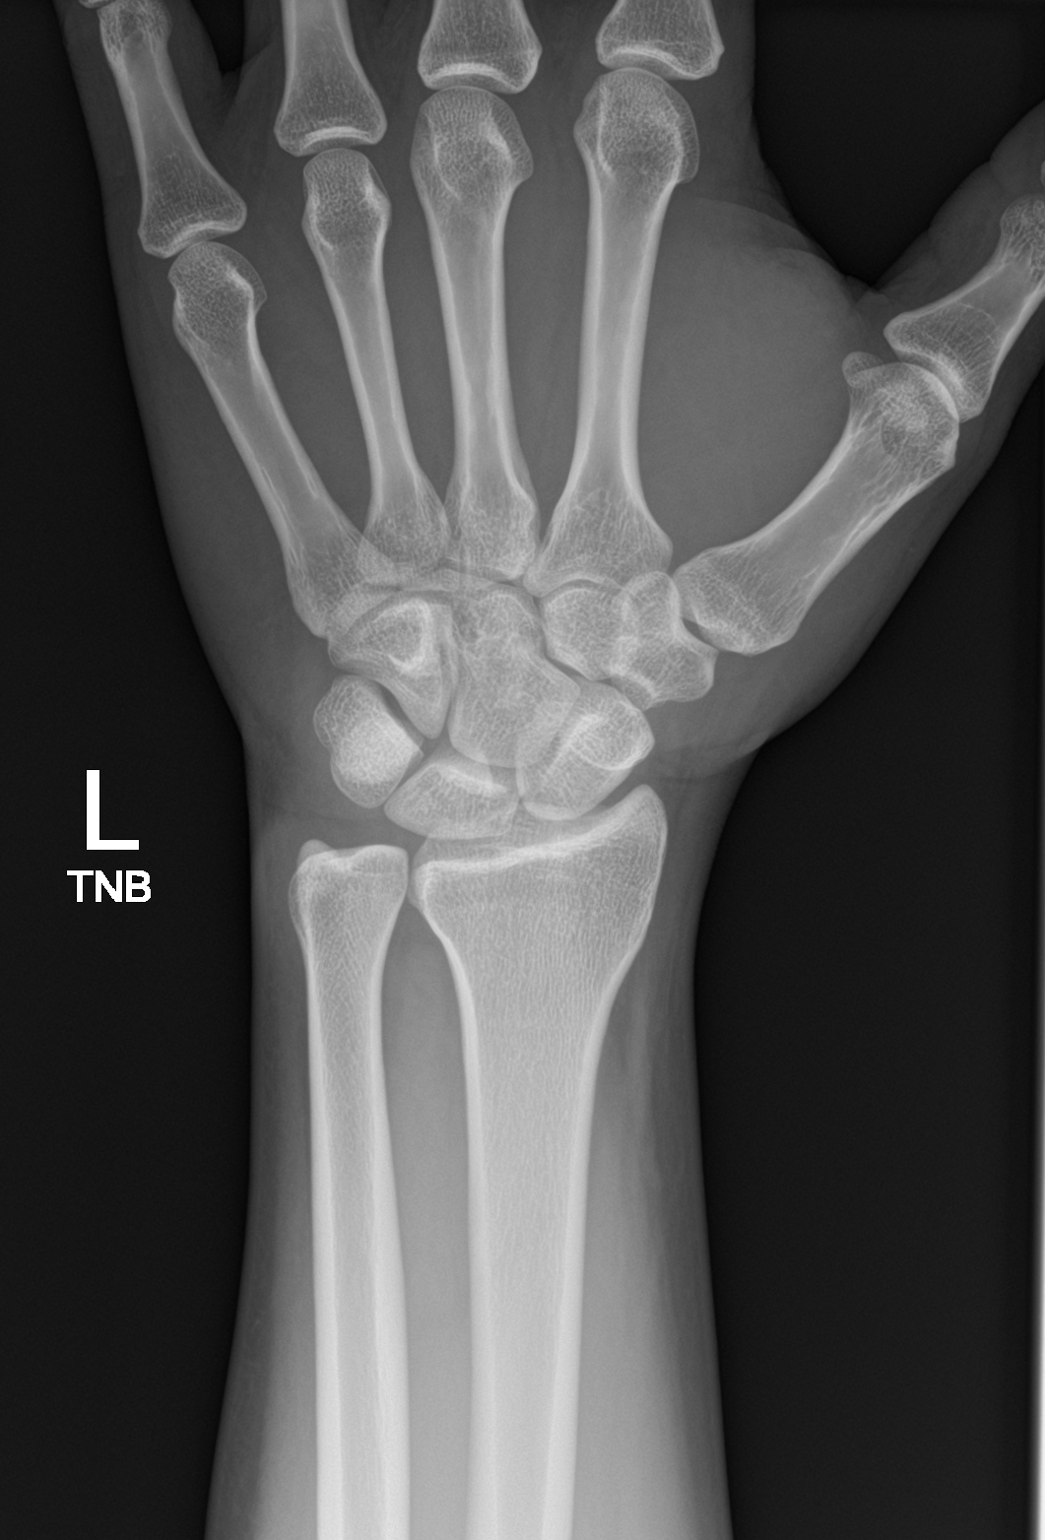

[wrist navicular]
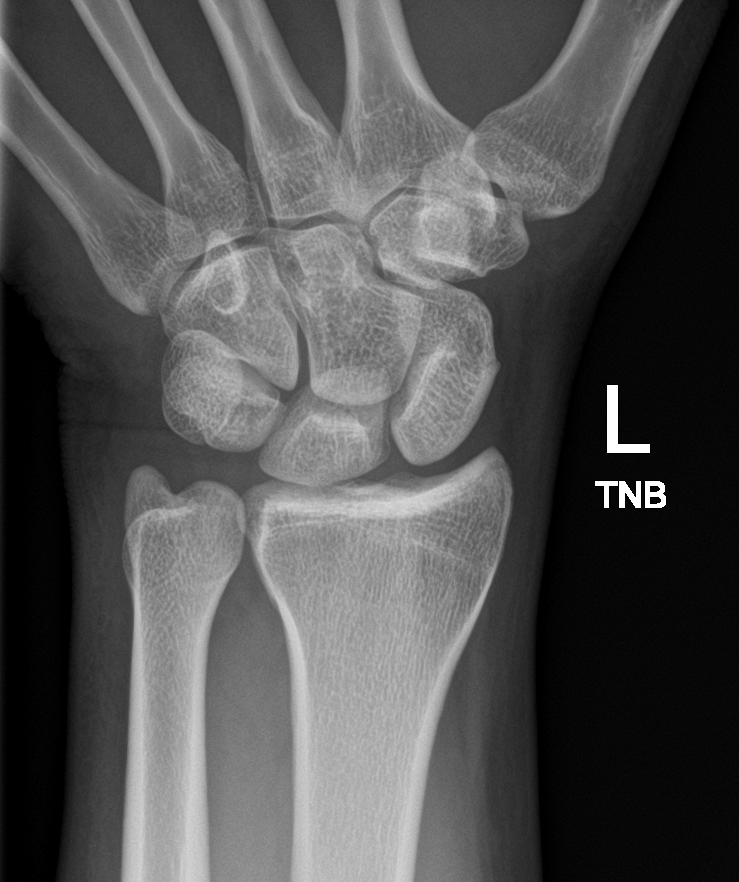

[wrist obl]
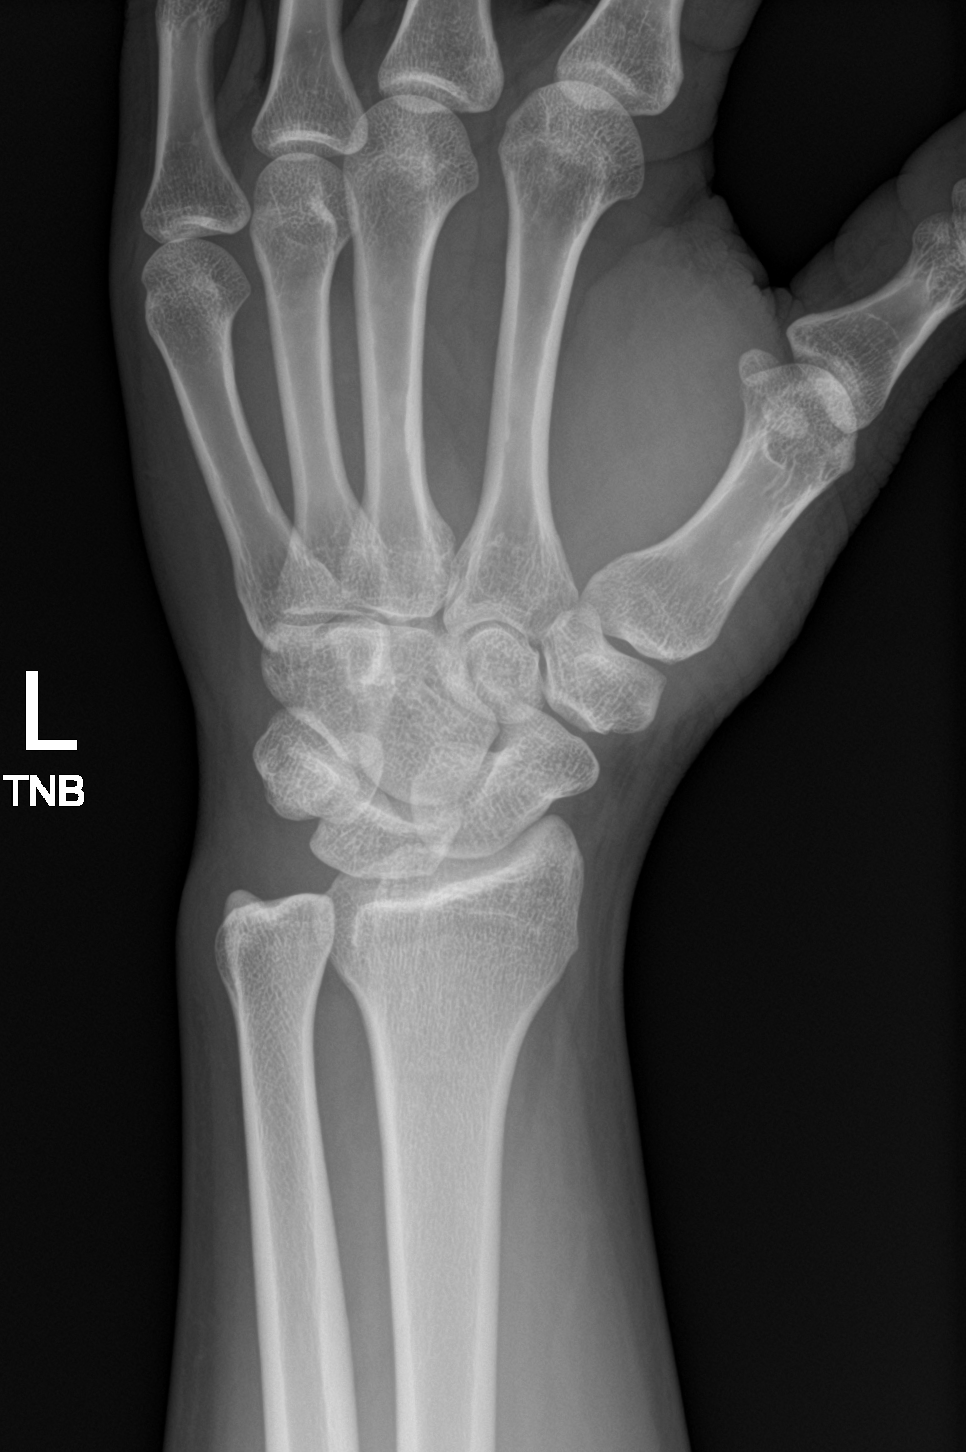

[wrist lat]
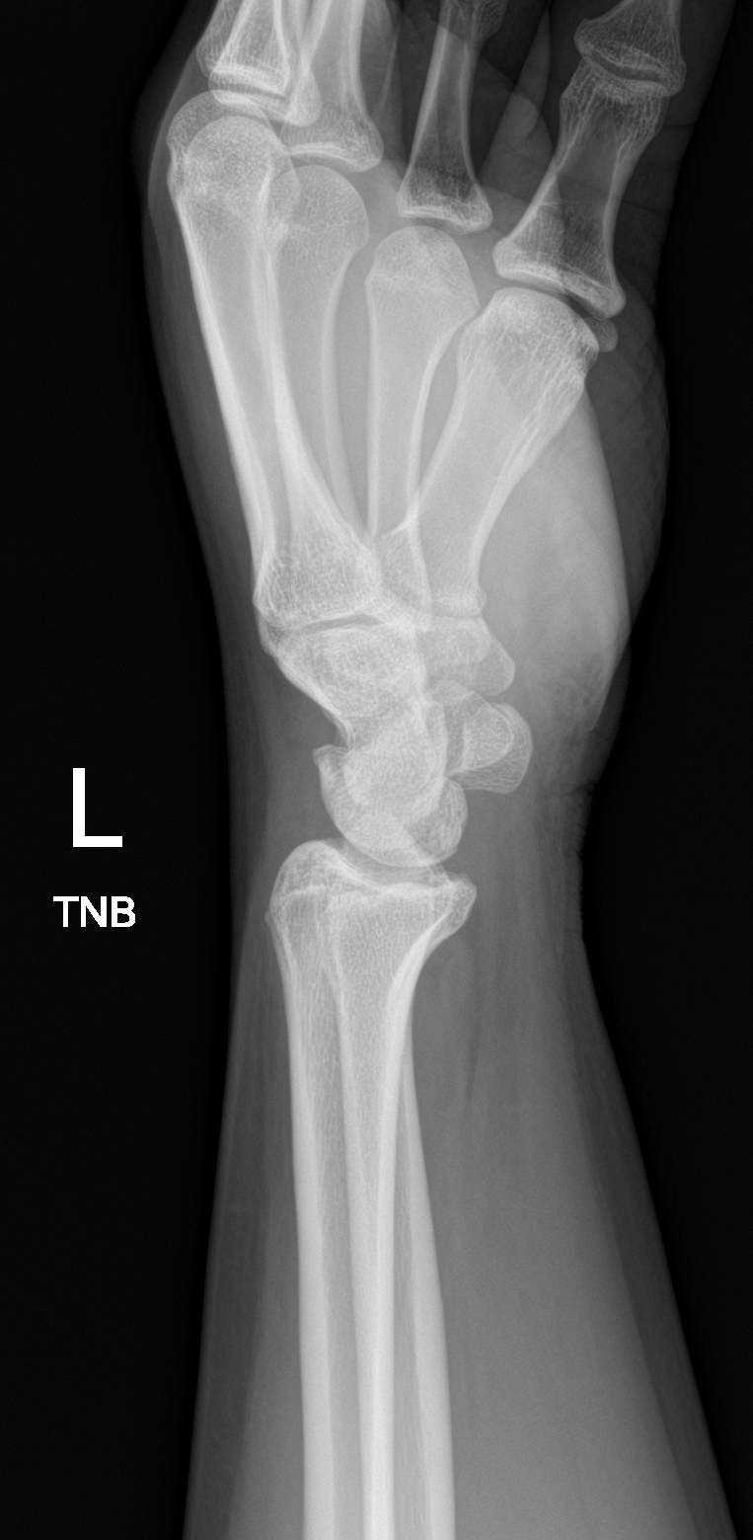

[4 of 4 positions shown; findings below may reference images not displayed]

FINDINGS: There is no evidence of fracture or dislocation. There is no
evidence of arthropathy or other focal bone abnormality. Soft
tissues are unremarkable.
IMPRESSION: No acute abnormality noted.

## 2022-05-23 ENCOUNTER — Other Ambulatory Visit: Payer: Self-pay | Admitting: Internal Medicine

## 2022-05-26 LAB — COMPLETE METABOLIC PANEL WITH GFR
AG Ratio: 1.6 (calc) (ref 1.0–2.5)
ALT: 27 U/L (ref 9–46)
AST: 35 U/L (ref 10–40)
Albumin: 5.2 g/dL — ABNORMAL HIGH (ref 3.6–5.1)
Alkaline phosphatase (APISO): 61 U/L (ref 36–130)
BUN: 23 mg/dL (ref 7–25)
CO2: 16 mmol/L — ABNORMAL LOW (ref 20–32)
Calcium: 10.2 mg/dL (ref 8.6–10.3)
Chloride: 116 mmol/L — ABNORMAL HIGH (ref 98–110)
Creat: 1.24 mg/dL (ref 0.60–1.26)
Globulin: 3.2 g/dL (calc) (ref 1.9–3.7)
Glucose, Bld: 122 mg/dL — ABNORMAL HIGH (ref 65–99)
Potassium: 5.1 mmol/L (ref 3.5–5.3)
Sodium: 154 mmol/L — ABNORMAL HIGH (ref 135–146)
Total Protein: 8.4 g/dL — ABNORMAL HIGH (ref 6.1–8.1)
eGFR: 78 mL/min/{1.73_m2} (ref >=60)

## 2022-05-26 LAB — LIPID PANEL
Cholesterol: 158 mg/dL (ref <200)
HDL: 60 mg/dL (ref >=40)
Non-HDL Cholesterol (Calc): 98 mg/dL (calc) (ref <130)
Total CHOL/HDL Ratio: 2.6 (calc) (ref <5.0)

## 2022-05-26 LAB — CBC
HCT: 45.5 % (ref 38.5–50.0)
Hemoglobin: 14.8 g/dL (ref 13.2–17.1)
MCH: 26.9 pg — ABNORMAL LOW (ref 27.0–33.0)
MCHC: 32.5 g/dL (ref 32.0–36.0)
MCV: 82.6 fL (ref 80.0–100.0)
MPV: 11 fL (ref 7.5–12.5)
Platelets: 220 10*3/uL (ref 140–400)
RBC: 5.51 10*6/uL (ref 4.20–5.80)
RDW: 13.9 % (ref 11.0–15.0)
WBC: 4.7 10*3/uL (ref 3.8–10.8)

## 2022-05-26 LAB — VITAMIN D 25 HYDROXY (VIT D DEFICIENCY, FRACTURES): Vit D, 25-Hydroxy: 34 ng/mL (ref 30–100)

## 2022-06-12 ENCOUNTER — Emergency Department (HOSPITAL_COMMUNITY)
Admission: EM | Admit: 2022-06-12 | Discharge: 2022-06-12 | Disposition: A | Discharge status: Home / Self Care | Payer: 59 | Attending: Emergency Medicine | Admitting: Emergency Medicine

## 2022-06-12 ENCOUNTER — Emergency Department (HOSPITAL_COMMUNITY): Payer: 59

## 2022-06-12 ENCOUNTER — Encounter (HOSPITAL_COMMUNITY): Payer: Self-pay

## 2022-06-12 ENCOUNTER — Other Ambulatory Visit: Payer: Self-pay

## 2022-06-12 DIAGNOSIS — R110 Nausea: Secondary | ICD-10-CM | POA: Diagnosis not present

## 2022-06-12 DIAGNOSIS — R1084 Generalized abdominal pain: Secondary | ICD-10-CM | POA: Insufficient documentation

## 2022-06-12 DIAGNOSIS — R109 Unspecified abdominal pain: Secondary | ICD-10-CM | POA: Diagnosis present

## 2022-06-12 LAB — COMPREHENSIVE METABOLIC PANEL
ALT: 31 U/L (ref 0–44)
AST: 39 U/L (ref 15–41)
Albumin: 4.4 g/dL (ref 3.5–5.0)
Alkaline Phosphatase: 51 U/L (ref 38–126)
Anion gap: 11 (ref 5–15)
BUN: 13 mg/dL (ref 6–20)
CO2: 21 mmol/L — ABNORMAL LOW (ref 22–32)
Calcium: 9.4 mg/dL (ref 8.9–10.3)
Chloride: 106 mmol/L (ref 98–111)
Creatinine, Ser: 0.93 mg/dL (ref 0.61–1.24)
GFR, Estimated: >60 mL/min (ref >60)
Glucose, Bld: 101 mg/dL — ABNORMAL HIGH (ref 70–99)
Potassium: 3.9 mmol/L (ref 3.5–5.1)
Sodium: 138 mmol/L (ref 135–145)
Total Bilirubin: 0.9 mg/dL (ref 0.3–1.2)
Total Protein: 7.3 g/dL (ref 6.5–8.1)

## 2022-06-12 LAB — CBC WITH DIFFERENTIAL/PLATELET
Abs Immature Granulocytes: 0.01 10*3/uL (ref 0.00–0.07)
Basophils Absolute: 0 10*3/uL (ref 0.0–0.1)
Basophils Relative: 1 %
Eosinophils Absolute: 0.1 10*3/uL (ref 0.0–0.5)
Eosinophils Relative: 1 %
HCT: 45.2 % (ref 39.0–52.0)
Hemoglobin: 15.1 g/dL (ref 13.0–17.0)
Immature Granulocytes: 0 %
Lymphocytes Relative: 23 %
Lymphs Abs: 1.1 10*3/uL (ref 0.7–4.0)
MCH: 27.5 pg (ref 26.0–34.0)
MCHC: 33.4 g/dL (ref 30.0–36.0)
MCV: 82.3 fL (ref 80.0–100.0)
Monocytes Absolute: 0.4 10*3/uL (ref 0.1–1.0)
Monocytes Relative: 8 %
Neutro Abs: 3.3 10*3/uL (ref 1.7–7.7)
Neutrophils Relative %: 67 %
Platelets: 202 10*3/uL (ref 150–400)
RBC: 5.49 MIL/uL (ref 4.22–5.81)
RDW: 13.2 % (ref 11.5–15.5)
WBC: 4.9 10*3/uL (ref 4.0–10.5)
nRBC: 0 % (ref 0.0–0.2)

## 2022-06-12 LAB — URINALYSIS, ROUTINE W REFLEX MICROSCOPIC
Bilirubin Urine: NEGATIVE
Glucose, UA: NEGATIVE mg/dL
Hgb urine dipstick: NEGATIVE
Ketones, ur: NEGATIVE mg/dL
Leukocytes,Ua: NEGATIVE
Nitrite: NEGATIVE
Protein, ur: NEGATIVE mg/dL
Specific Gravity, Urine: 1.017 (ref 1.005–1.030)
pH: 5 (ref 5.0–8.0)

## 2022-06-12 LAB — LIPASE, BLOOD: Lipase: 28 U/L (ref 11–51)

## 2022-06-12 MED ORDER — ACETAMINOPHEN 500 MG PO TABS
1000.0000 mg | ORAL_TABLET | Freq: Once | ORAL | Status: AC
  Administered 2022-06-12: 1000 mg via ORAL
  Filled 2022-06-12: qty 2

## 2022-06-12 MED ORDER — IOHEXOL 350 MG/ML SOLN
75.0000 mL | Freq: Once | INTRAVENOUS | Status: AC | PRN
  Administered 2022-06-12: 75 mL via INTRAVENOUS

## 2022-06-12 MED ORDER — SODIUM CHLORIDE 0.9 % IV BOLUS
1000.0000 mL | Freq: Once | INTRAVENOUS | Status: AC
  Administered 2022-06-12: 1000 mL via INTRAVENOUS

## 2022-06-12 NOTE — Discharge Instructions (Signed)
Jason Vargas  Thank you for allowing [place] to take care of you today.  You came to the Emergency Department today because you are having some belly pain, diarrhea, and headache beginning since last night, and it is possible that you have a viral infection that is causing all of your symptoms, in terms of your abdominal pain, we did labs and imaging to rule out any emergency causes of belly pain, particularly appendicitis which is inflammation of an organ called the appendix that lives attached to your colon.  We did not find any evidence of any infections in your belly on your CT scan.  We did see a fecalith next to your appendix, which sometimes can raise your risk for appendicitis, so if you ever develop severe pain in the right lower part of your belly, that would be a reason to come back to the emergency department for reevaluation, however you do not need that at this time.  You can use Tylenol and ibuprofen every 4-6 hours as needed for pain control.  To-Do: 1. Please follow-up with your primary doctor within 2 days / as soon as possible.   Please return to the Emergency Department or call 911 if you experience have worsening of your symptoms, or do not get better, chest pain, shortness of breath, severe or significantly worsening pain, high fever, severe confusion, pass out or have any reason to think that you need emergency medical care.   We hope you feel better soon.   Curley Spice, MD Department of Emergency Medicine [facility]

## 2022-06-12 NOTE — ED Triage Notes (Signed)
Pt arrives ambulatory via POV with c/o of right sided abdominal pain, diarrhea and self reporting of becoming really sweaty and passing out. Nausea reported.

## 2022-06-12 NOTE — ED Provider Notes (Signed)
  [place] EMERGENCY DEPARTMENT AT [facility] Transfer of Care Note I assumed care of Jason Vargas on 06/12/2022 at 3 p.m. from [place], [place].   Briefly, Jason Vargas is a 37 y.o. male who: PMHx: None pertinent P/w syncopal episode, nausea, vomiting, diarrhea, initially periumbilical pain migrating to the right lower quadrant, concerning for appendicitis, labs reassuring   Plan at the time of handoff: Follow-up CT of the abdomen and pelvis   Please refer to the original provider's note for additional information regarding the care of [name].  Reassessment: I personally reassessed the patient: Patient without any acute complaints or additional questions.  Vital Signs:  ED Triage Vitals  Enc Vitals Group     BP 06/12/22 1043 136/84     Pulse Rate 06/12/22 1043 79     Resp 06/12/22 1043 16     Temp 06/12/22 1043 99.1 F (37.3 C)     Temp Source 06/12/22 1043 Oral     SpO2 06/12/22 1043 98 %     Weight 06/12/22 1044 201 lb 1 oz (91.2 kg)     Height 06/12/22 1044 5\' 11"  (1.803 m)     Head Circumference --      Peak Flow --      Pain Score 06/12/22 1044 8     Pain Loc --      Pain Edu? --      Excl. in [place]? --      Hemodynamics:  The patient is hemodynamically stable. Mental Status:  The patient is alert Abdomen: Negative McBurney's, psoas, obturator signs  Additional MDM: On reevaluation, patient denies abdominal pain, reports that headache is improving with fluids.  Abdomen is soft, nontender on serial reevaluations.  CT abdomen and pelvis does reveal appendicolith, however given patient has benign abdominal exam, low index of suspicion for recurrent appendicitis, no evidence of acute fecalith on CT imaging.  Discussed this with the patient at bedside, discussed that  fecalith could migrate, and that should he develop worsening right lower quadrant pain, that would be a reason for reevaluation in the ED, otherwise recommended relative rest, hydration,  and follow-up with PCP.  Patient and loved ones at bedside expressed understanding.  Disposition: Discharge  Patient seen in conjunction with Dr. Renelda Loma, MD Emergency Medicine, PGY-2   Curley Spice, MD 06/12/22 1941    Lorre Nick, MD 06/15/22 1524

## 2022-06-12 NOTE — ED Provider Notes (Signed)
I saw and evaluated the patient, reviewed the resident's note and I agree with the findings and plan.   37 year old male presents with epigastric pain that radiated to his right lower quadrant.  Abdominal CT was negative here.  His abdominal exam is benign as well to.  Will discharge home   Lorre Nick, MD 06/12/22 [phone]

## 2022-06-12 NOTE — ED Provider Notes (Signed)
[place] EMERGENCY DEPARTMENT AT [facility] Provider Note   CSN: [id number] Arrival date & time: 06/12/22  1030     History  Chief Complaint  Patient presents with   Abdominal Pain    Luvern Finkbiner is a 37 y.o. male.  Patient presents to the emergency room complaining of right-sided abdominal pain, diarrhea, and nausea.  Patient states that symptoms began last night with periumbilical pain.  He states that pain radiated to the right side after that time.  Patient currently reports pain is mild.  He denies emesis at this time.  He does endorse multiple episodes of diarrhea which began overnight.  Patient also reports 1 near syncopal episode with some lightheadedness.  He feels that he has not been drinking of fluids today since being at the hospital and being unable to drink.  Past medical history significant for unspecified chest pain, reflux  HPI     Home Medications Prior to Admission medications   Not on File      Allergies    Patient has no known allergies.    Review of Systems   Review of Systems  Physical Exam Updated Vital Signs BP (!) 124/94 (BP Location: Left Arm)   Pulse 80   Temp 98.7 F (37.1 C) (Oral)   Resp 16   Ht 5\' 11"  (1.803 m)   Wt 91.2 kg   SpO2 96%   BMI 28.04 kg/m  Physical Exam Vitals and nursing note reviewed.  Constitutional:      General: He is not in acute distress.    Appearance: He is well-developed.  HENT:     Head: Normocephalic and atraumatic.  Eyes:     Conjunctiva/sclera: Conjunctivae normal.  Cardiovascular:     Rate and Rhythm: Normal rate and regular rhythm.     Heart sounds: No murmur heard. Pulmonary:     Effort: Pulmonary effort is normal. No respiratory distress.     Breath sounds: Normal breath sounds.  Abdominal:     Palpations: Abdomen is soft.     Tenderness: There is generalized abdominal tenderness.  Musculoskeletal:        General: No swelling.     Cervical back: Neck supple.  Skin:     General: Skin is warm and dry.     Capillary Refill: Capillary refill takes less than 2 seconds.  Neurological:     Mental Status: He is alert.  Psychiatric:        Mood and Affect: Mood normal.     ED Results / Procedures / Treatments   Labs (all labs ordered are listed, but only abnormal results are displayed) Labs Reviewed  COMPREHENSIVE METABOLIC PANEL - Abnormal; Notable for the following components:      Result Value   CO2 21 (*)    Glucose, Bld 101 (*)    All other components within normal limits  LIPASE, BLOOD  CBC WITH DIFFERENTIAL/PLATELET  URINALYSIS, ROUTINE W REFLEX MICROSCOPIC    EKG None  Radiology No results found.  Procedures Procedures    Medications Ordered in ED Medications  sodium chloride 0.9 % bolus 1,000 mL (has no administration in time range)  iohexol (OMNIPAQUE) 350 MG/ML injection 75 mL (75 mLs Intravenous Contrast Given 06/12/22 1531)    ED Course/ Medical Decision Making/ A&P Clinical Course as of 06/12/22 1534  Thu Jun 12, 2022  1459 Periumbilical pain since last night, works at [employer], N/V, r/o appy,, labs okay, likely enteritis [ ]  FU CT [LS]  Clinical Course User Index [LS] Curley Spice, MD                             Medical Decision Making Amount and/or Complexity of Data Reviewed Labs: ordered. Radiology: ordered.   This patient presents to the ED for concern of abdominal pain, this involves an extensive number of treatment options, and is a complaint that carries with it a high risk of complications and morbidity.  The differential diagnosis includes gastroenteritis, appendicitis, cholecystitis, colitis, others   Co morbidities that complicate the patient evaluation  History of chest pain   Lab Tests:  I Ordered, and personally interpreted labs.  The pertinent results include: Grossly unremarkable CMP, CBC, UA, lipase   Imaging Studies ordered:  I ordered imaging studies including CT abdomen  pelvis with contrast  Problem List / ED Course / Critical interventions / Medication management   I ordered medication including normal saline for fluid resuscitation Reevaluation of the patient after these medicines showed that the patient improved I have reviewed the patients home medicines and have made adjustments as needed  Test / Admission - Considered:  Patient presents with generalized abdominal pain, initially in the periumbilical area.  Abdominal pain is mild at this time but still have some concern for possible early appendicitis.  Patient care being transferred to Dr. Warden Fillers at shift handoff.  Disposition pending results of CT scan.  If scan is benign, feel that symptoms are most likely gastroenteritis.        Final Clinical Impression(s) / ED Diagnoses Final diagnoses:  Generalized abdominal pain  Nausea    Rx / DC Orders ED Discharge Orders     None         Pamala Duffel 06/12/22 1534    Sloan Leiter, DO 06/19/22 1535

## 2023-01-02 ENCOUNTER — Encounter (HOSPITAL_COMMUNITY): Payer: Self-pay | Admitting: Emergency Medicine

## 2023-01-02 ENCOUNTER — Ambulatory Visit (HOSPITAL_COMMUNITY)
Admission: EM | Admit: 2023-01-02 | Discharge: 2023-01-02 | Disposition: A | Discharge status: Home / Self Care | Payer: 59 | Attending: Physician Assistant | Admitting: Physician Assistant

## 2023-01-02 DIAGNOSIS — S76312A Strain of muscle, fascia and tendon of the posterior muscle group at thigh level, left thigh, initial encounter: Secondary | ICD-10-CM

## 2023-01-02 MED ORDER — DICLOFENAC SODIUM 75 MG PO TBEC: 75.0000 mg | DELAYED_RELEASE_TABLET | Freq: Two times a day (BID) | ORAL | 0 refills | Status: DC

## 2023-01-02 NOTE — Discharge Instructions (Signed)
Return if any problems.  See the Orthopaedist or the sports medicine doctor for recheck next week.

## 2023-01-02 NOTE — ED Triage Notes (Signed)
Pt having left upper posterior leg pain for a week. Unsure if injured when doing dead lifts last week in the gym. Pt now having pain in ankle and knee when runs. Tried taking Tylenol

## 2023-01-02 NOTE — ED Provider Notes (Signed)
[facility]    CSN: [id number] Arrival date & time: 01/02/23  1530      History   Chief Complaint Chief Complaint  Patient presents with   Leg Pain    HPI Jason Vargas is a 37 y.o. male.   Patient complains of pain to his left posterior thigh.  Patient reports he was doing dead lifts and began having pain.  Patient reports the pain has persist for the past week.  Patient complains of pain when he is lifting his leg.  Patient complains of pain with exertion.  Patient points to the mid area of his hamstring.  Pain radiates around to the side of his knee.  He reports he is in the [employer] he is supposed to see the sports medicine doctor next week.  The history is provided by the patient. No language interpreter was used.  Leg Pain Pain details:    Quality:  Aching   Radiates to:  Does not radiate   Severity:  Moderate   Onset quality:  Gradual   Timing:  Constant   Progression:  Worsening Chronicity:  New Dislocation: no   Relieved by:  Nothing Worsened by:  Nothing Ineffective treatments:  None tried Associated symptoms: no decreased ROM     Past Medical History:  Diagnosis Date   Chest pain    Hx of echocardiogram    Echo 7/14: EF 60-65%, normal WM   Reflux     Patient Active Problem List   Diagnosis Date Noted   Chest pain, unspecified 10/04/2012    History reviewed. No pertinent surgical history.     Home Medications    Prior to Admission medications   Not on File    Family History Family History  Problem Relation Age of Onset   Healthy Mother    Healthy Father    Heart attack Neg Hx    Sudden death Neg Hx     Social History Social History   Tobacco Use   Smoking status: Never   Smokeless tobacco: Never  Vaping Use   Vaping status: Never Used  Substance Use Topics   Alcohol use: Yes    Comment: Occassional    Drug use: No     Allergies   Patient has no known allergies.   Review of Systems Review of Systems  All  other systems reviewed and are negative.    Physical Exam Triage Vital Signs ED Triage Vitals [01/02/23 1705]  Encounter Vitals Group     BP 119/71     Systolic BP Percentile      Diastolic BP Percentile      Pulse Rate 67     Resp 14     Temp 97.9 F (36.6 C)     Temp Source Oral     SpO2 98 %     Weight      Height      Head Circumference      Peak Flow      Pain Score 8     Pain Loc      Pain Education      Exclude from Growth Chart    No data found.  Updated Vital Signs BP 119/71 (BP Location: Right Arm)   Pulse 67   Temp 97.9 F (36.6 C) (Oral)   Resp 14   SpO2 98%   Visual Acuity Right Eye Distance:   Left Eye Distance:   Bilateral Distance:    Right Eye Near:   Left  Eye Near:    Bilateral Near:     Physical Exam Vitals and nursing note reviewed.  Constitutional:      Appearance: He is well-developed.  HENT:     Head: Normocephalic.  Cardiovascular:     Rate and Rhythm: Normal rate.  Pulmonary:     Effort: Pulmonary effort is normal.  Abdominal:     General: There is no distension.  Musculoskeletal:        General: Normal range of motion.     Cervical back: Normal range of motion.  Skin:    General: Skin is warm.  Neurological:     General: No focal deficit present.     Mental Status: He is alert and oriented to person, place, and time.      UC Treatments / Results  Labs (all labs ordered are listed, but only abnormal results are displayed) Labs Reviewed - No data to display  EKG   Radiology No results found.  Procedures Procedures (including critical care time)  Medications Ordered in UC Medications - No data to display  Initial Impression / Assessment and Plan / UC Course  I have reviewed the triage vital signs and the nursing notes.  Pertinent labs & imaging results that were available during my care of the patient were reviewed by me and considered in my medical decision making (see chart for details).      Final  Clinical Impressions(s) / UC Diagnoses   Final diagnoses:  Strain of left hamstring, initial encounter     Discharge Instructions      Return if any problems.  See the Orthopaedist or the sports medicine doctor for recheck next week.    An After Visit Summary was printed and given to the patient.  ED Prescriptions   None    PDMP not reviewed this encounter. An After Visit Summary was printed and given to the patient.       Elson Areas, [place] 01/02/23 1753

## 2023-03-04 ENCOUNTER — Ambulatory Visit (HOSPITAL_COMMUNITY)
Admission: EM | Admit: 2023-03-04 | Discharge: 2023-03-04 | Disposition: A | Discharge status: Home / Self Care | Payer: Self-pay | Attending: Sports Medicine | Admitting: Sports Medicine

## 2023-03-04 ENCOUNTER — Other Ambulatory Visit: Payer: Self-pay

## 2023-03-04 ENCOUNTER — Encounter (HOSPITAL_COMMUNITY): Payer: Self-pay | Admitting: Emergency Medicine

## 2023-03-04 DIAGNOSIS — R42 Dizziness and giddiness: Secondary | ICD-10-CM | POA: Insufficient documentation

## 2023-03-04 DIAGNOSIS — S76302D Unspecified injury of muscle, fascia and tendon of the posterior muscle group at thigh level, left thigh, subsequent encounter: Secondary | ICD-10-CM | POA: Insufficient documentation

## 2023-03-04 DIAGNOSIS — R5383 Other fatigue: Secondary | ICD-10-CM | POA: Insufficient documentation

## 2023-03-04 DIAGNOSIS — R55 Syncope and collapse: Secondary | ICD-10-CM | POA: Insufficient documentation

## 2023-03-04 LAB — CBC WITH DIFFERENTIAL/PLATELET
Abs Immature Granulocytes: 0.01 10*3/uL (ref 0.00–0.07)
Basophils Absolute: 0.1 10*3/uL (ref 0.0–0.1)
Basophils Relative: 1 %
Eosinophils Absolute: 0.2 10*3/uL (ref 0.0–0.5)
Eosinophils Relative: 3 %
HCT: 44.6 % (ref 39.0–52.0)
Hemoglobin: 14.6 g/dL (ref 13.0–17.0)
Immature Granulocytes: 0 %
Lymphocytes Relative: 45 %
Lymphs Abs: 1.9 10*3/uL (ref 0.7–4.0)
MCH: 27.3 pg (ref 26.0–34.0)
MCHC: 32.7 g/dL (ref 30.0–36.0)
MCV: 83.4 fL (ref 80.0–100.0)
Monocytes Absolute: 0.4 10*3/uL (ref 0.1–1.0)
Monocytes Relative: 8 %
Neutro Abs: 1.9 10*3/uL (ref 1.7–7.7)
Neutrophils Relative %: 43 %
Platelets: 183 10*3/uL (ref 150–400)
RBC: 5.35 MIL/uL (ref 4.22–5.81)
RDW: 13.3 % (ref 11.5–15.5)
WBC: 4.4 10*3/uL (ref 4.0–10.5)
nRBC: 0 % (ref 0.0–0.2)

## 2023-03-04 LAB — POCT URINALYSIS DIP (MANUAL ENTRY)
Bilirubin, UA: NEGATIVE
Blood, UA: NEGATIVE
Glucose, UA: NEGATIVE mg/dL
Ketones, POC UA: NEGATIVE mg/dL
Leukocytes, UA: NEGATIVE
Nitrite, UA: NEGATIVE
Protein Ur, POC: NEGATIVE mg/dL
Spec Grav, UA: 1.025
Urobilinogen, UA: 0.2 U/dL
pH, UA: 6

## 2023-03-04 LAB — TSH: TSH: 1.556 u[IU]/mL (ref 0.350–4.500)

## 2023-03-04 NOTE — ED Provider Notes (Signed)
 [facility]    CSN: [id number] Arrival date & time: 03/04/23  1047      History   Chief Complaint No chief complaint on file.  HPI Jason Vargas is a 38 y.o. male.   [name] is a 38 yo without significant medial history here with complaint of fatigue and alternating between heat flashes and cold chills for roughly 5 days. 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 Resolved with sitting for a few minutes and hydrating. He had mild headache after that. Since he hasn't had recurrence of that but has felt fatigued and having altering hot/cold flashes. He otherwise has felt fine. He exercises regularly and states that he is asx while running but afterwards will have return of hot/cold flashes. He denies known sick contact or recent travel. He is from [place] but has been in the [place]  for 15 years. He states he was recently checked for TB and hepatitis and received negative results yesterday. He denies sore throat, cough, congestion, chest pain, breathing troubles, abdominal pain, stool changes. No recurrence of the presyncope. He has been drinking water with liquid IV powder the past few days. No other significant dietary changes. Only medication is diclofenac  for recent left hamstring strain that has been improving.   The history is provided by the patient.    Past Medical History:  Diagnosis Date   Chest pain    Hx of echocardiogram    Echo 7/14: EF 60-65%, normal WM   Reflux     Patient Active Problem List   Diagnosis Date Noted   Chest pain, unspecified 10/04/2012    History reviewed. No pertinent surgical history.     Home Medications    Prior to Admission medications   Medication Sig Start Date End Date Taking? Authorizing Provider  diclofenac  (VOLTAREN ) 75 MG EC tablet Take 1 tablet (75 mg total) by mouth 2 (two) times daily. 01/02/23   Flint Sonny POUR, PA-C    Family History Family  History  Problem Relation Age of Onset   Healthy Mother    Healthy Father    Heart attack Neg Hx    Sudden death Neg Hx     Social History Social History   Tobacco Use   Smoking status: Never   Smokeless tobacco: Never  Vaping Use   Vaping status: Never Used  Substance Use Topics   Alcohol use: Yes    Comment: Occassional    Drug use: No     Allergies   Patient has no known allergies.   Review of Systems Review of Systems  Reason unable to perform ROS: See HPI.     Physical Exam Triage Vital Signs ED Triage Vitals  Encounter Vitals Group     BP 03/04/23 1102 128/77     Systolic BP Percentile --      Diastolic BP Percentile --      Pulse Rate 03/04/23 1102 66     Resp 03/04/23 1102 18     Temp 03/04/23 1102 97.8 F (36.6 C)     Temp Source 03/04/23 1102 Oral     SpO2 03/04/23 1102 97 %     Weight --      Height --      Head Circumference --      Peak Flow --      Pain Score 03/04/23 1100 7     Pain Loc --  Pain Education --      Exclude from Growth Chart --    No data found.  Updated Vital Signs BP 128/77 (BP Location: Right Arm) Comment (BP Location): large cuff  Pulse 66   Temp 97.8 F (36.6 C) (Oral)   Resp 18   SpO2 97%   Visual Acuity Right Eye Distance:   Left Eye Distance:   Bilateral Distance:    Right Eye Near:   Left Eye Near:    Bilateral Near:     Physical Exam Constitutional:      General: He is not in acute distress.    Appearance: Normal appearance. He is normal weight. He is not ill-appearing or toxic-appearing.  HENT:     Head: Normocephalic and atraumatic.     Right Ear: Tympanic membrane and ear canal normal.     Left Ear: Tympanic membrane and ear canal normal.     Nose: Nose normal. No congestion.     Mouth/Throat:     Mouth: Mucous membranes are moist.     Pharynx: Oropharynx is clear. No oropharyngeal exudate.  Eyes:     General: No scleral icterus.    Extraocular Movements: Extraocular movements intact.      Conjunctiva/sclera: Conjunctivae normal.     Pupils: Pupils are equal, round, and reactive to light.  Neck:     Comments: No thyroid  nodules or goiter present on inspection or palpation. Cardiovascular:     Rate and Rhythm: Normal rate and regular rhythm.     Pulses: Normal pulses.     Heart sounds: Normal heart sounds. No murmur heard.    No gallop.  Pulmonary:     Effort: Pulmonary effort is normal. No respiratory distress.     Breath sounds: Normal breath sounds. No wheezing.  Abdominal:     General: Abdomen is flat. There is no distension.     Palpations: Abdomen is soft. There is no mass.     Tenderness: There is no abdominal tenderness.  Musculoskeletal:        General: No swelling or deformity. Normal range of motion.     Cervical back: Normal range of motion and neck supple. No rigidity or tenderness.     Comments: Left hamstring distal tendons mildly TTP especially with resisted knee flexion.  Lymphadenopathy:     Cervical: No cervical adenopathy.  Skin:    General: Skin is warm.     Capillary Refill: Capillary refill takes less than 2 seconds.     Coloration: Skin is not jaundiced.     Findings: No rash.  Neurological:     General: No focal deficit present.     Mental Status: He is alert and oriented to person, place, and time.  Psychiatric:        Mood and Affect: Mood normal.        Behavior: Behavior normal.        Thought Content: Thought content normal.        Judgment: Judgment normal.      UC Treatments / Results  Labs (all labs ordered are listed, but only abnormal results are displayed) Labs Reviewed  CBC WITH DIFFERENTIAL/PLATELET  COMPREHENSIVE METABOLIC PANEL  TSH  POCT URINALYSIS DIP (MANUAL ENTRY)    EKG 03/04/2023 at 11:58am Sinus bradycardia (VR 58bpm) otherwise nml EKG.  Radiology No results found.  Procedures Procedures (including critical care time)  Medications Ordered in UC Medications - No data to display  Initial  Impression / Assessment and Plan /  UC Course  I have reviewed the triage vital signs and the nursing notes.  Pertinent labs & imaging results that were available during my care of the patient were reviewed by me and considered in my medical decision making (see chart for details).  Clinical Course as of 03/04/23 1213  Wed Mar 04, 2023  1210 POC urinalysis dipstick [AC]    Clinical Course User Index [AC] Levorn Prentice BIRCH, MD   Vitals and triage reviewed, patient is hemodynamically stable.  Unclear exact etiology of his presentation. Exam, EKG, and POC urinalysis all normal today. I will check CMP, CBC and TSH. Negative Hepatitis panel and TB testing at work recently per patient report. Reassurance provided pending additional information from his labs, recommended he continue hydrating well and return if issues persist or return. Reviewed askling protocol with the patient for hamstring rehabilitation. Patient's questions were answered and they are in agreement with this plan.   Final Clinical Impressions(s) / UC Diagnoses   Final diagnoses:  Fatigue, unspecified type  Postural dizziness with presyncope  Left hamstring injury, subsequent encounter     Discharge Instructions      You were seen today for fatigue, hot/cold spells, and one episode of dizziness. Your workup included a normal physical exam, normal EKG, and blood work (CBC, CMP, Urinalysis, and TSH) - I will review these lab results with you once resulted. For your left leg start to do the Askling Protocol for hamstring rehabilitation as discussed.     ED Prescriptions   None    PDMP not reviewed this encounter.   Levorn Prentice BIRCH, MD 03/04/23 [phone]

## 2023-03-04 NOTE — Discharge Instructions (Addendum)
 You were seen today for fatigue, hot/cold spells, and one episode of dizziness. Your workup included a normal physical exam, normal EKG, and blood work (CBC, CMP, Urinalysis, and TSH) - I will review these lab results with you once resulted. For your left leg start to do the Askling Protocol for hamstring rehabilitation as discussed.

## 2023-03-04 NOTE — ED Triage Notes (Signed)
 For 5 days has felt tired for no reason, hot and cold chills. Complains of dizziness.  Patient is consuming "liquid iv" products  Left knee and thigh has continued to bother patient.  Not as bad.  Has one diclofenac tablet remaining.

## 2023-07-28 ENCOUNTER — Encounter (HOSPITAL_COMMUNITY): Payer: Self-pay | Admitting: *Deleted

## 2023-07-28 ENCOUNTER — Ambulatory Visit (HOSPITAL_COMMUNITY)
Admission: EM | Admit: 2023-07-28 | Discharge: 2023-07-28 | Disposition: A | Discharge status: Home / Self Care | Payer: Self-pay | Attending: Internal Medicine | Admitting: Internal Medicine

## 2023-07-28 DIAGNOSIS — H5789 Other specified disorders of eye and adnexa: Secondary | ICD-10-CM

## 2023-07-28 MED ORDER — FLUORESCEIN SODIUM 1 MG OP STRP
ORAL_STRIP | OPHTHALMIC | Status: AC
  Filled 2023-07-28: qty 1

## 2023-07-28 MED ORDER — TETRACAINE HCL 0.5 % OP SOLN
OPHTHALMIC | Status: AC
  Filled 2023-07-28: qty 4

## 2023-07-28 MED ORDER — TETRACAINE HCL 0.5 % OP SOLN
2.0000 [drp] | Freq: Once | OPHTHALMIC | Status: AC
  Administered 2023-07-28: 2 [drp] via OPHTHALMIC

## 2023-07-28 NOTE — ED Provider Notes (Addendum)
 [facility]    CSN: [id number] Arrival date & time: 07/28/23  1625      History   Chief Complaint Chief Complaint  Patient presents with   Eye Problem    HPI Jason Vargas is a 38 y.o. male.   38 year old male who presents urgent care with complaints of Jason Vargas areas on the surface of his eyes.  He reports that last week he did get some dust or sand in his eyes that caused irritation.  He also related that his eyes were looking red because he does not get a lot of sleep at night.  He tried using an over-the-counter allergy eyedrop but this burned a lot.  He then talked to the pharmacist to recommend using an eye lubrication drop.  When he started using this he started noticing Jason Vargas areas on the surface of his eyes.  He has not had any visual changes, pain, intermittent loss of vision, fevers, chills, headaches associated with his eyes.  He does not wear contacts and does not use glasses.   Eye Problem Associated symptoms: redness   Associated symptoms: no vomiting     Past Medical History:  Diagnosis Date   Chest pain    Hx of echocardiogram    Echo 7/14: EF 60-65%, normal WM   Reflux     Patient Active Problem List   Diagnosis Date Noted   Chest pain, unspecified 10/04/2012    History reviewed. No pertinent surgical history.     Home Medications    Prior to Admission medications   Medication Sig Start Date End Date Taking? Authorizing Provider  diclofenac  (VOLTAREN ) 75 MG EC tablet Take 1 tablet (75 mg total) by mouth 2 (two) times daily. 01/02/23   Sandi Crosby, PA-C    Family History Family History  Problem Relation Age of Onset   Healthy Mother    Healthy Father    Heart attack Neg Hx    Sudden death Neg Hx     Social History Social History   Tobacco Use   Smoking status: Never   Smokeless tobacco: Never  Vaping Use   Vaping status: Never Used  Substance Use Topics   Alcohol use: Yes    Comment: Occassional    Drug use: No      Allergies   Patient has no known allergies.   Review of Systems Review of Systems  Constitutional:  Negative for chills and fever.  HENT:  Negative for ear pain and sore throat.   Eyes:  Positive for redness. Negative for pain and visual disturbance.  Respiratory:  Negative for cough and shortness of breath.   Cardiovascular:  Negative for chest pain and palpitations.  Gastrointestinal:  Negative for abdominal pain and vomiting.  Genitourinary:  Negative for dysuria and hematuria.  Musculoskeletal:  Negative for arthralgias and back pain.  Skin:  Negative for color change and rash.  Neurological:  Negative for seizures and syncope.  All other systems reviewed and are negative.    Physical Exam Triage Vital Signs ED Triage Vitals  Encounter Vitals Group     BP 07/28/23 1654 133/78     Systolic BP Percentile --      Diastolic BP Percentile --      Pulse Rate 07/28/23 1654 81     Resp 07/28/23 1654 18     Temp 07/28/23 1654 98.3 F (36.8 C)     Temp Source 07/28/23 1654 Oral     SpO2 07/28/23 1654 97 %  Weight --      Height --      Head Circumference --      Peak Flow --      Pain Score 07/28/23 1653 0     Pain Loc --      Pain Education --      Exclude from Growth Chart --    No data found.  Updated Vital Signs BP 133/78 (BP Location: Right Arm)   Pulse 81   Temp 98.3 F (36.8 C) (Oral)   Resp 18   SpO2 97%   Visual Acuity Right Eye Distance: 20/25 Left Eye Distance: 20/25 Bilateral Distance: 20/25  Right Eye Near:   Left Eye Near:    Bilateral Near:     Physical Exam Vitals and nursing note reviewed.  Constitutional:      General: He is not in acute distress.    Appearance: He is well-developed.  HENT:     Head: Normocephalic and atraumatic.  Eyes:     General: Vision grossly intact. No visual field deficit.       Right eye: No foreign body or discharge.        Left eye: No foreign body or discharge.     Extraocular Movements:  Extraocular movements intact.     Conjunctiva/sclera: Conjunctivae normal.     Right eye: Right conjunctiva is not injected. No exudate or hemorrhage.    Left eye: Left conjunctiva is not injected. No exudate.    Comments: Fluorescein staining was negative.  There are deposits on the eye just medial and lateral to the cornea that do not take a dye.  Cardiovascular:     Rate and Rhythm: Normal rate and regular rhythm.     Heart sounds: No murmur heard. Pulmonary:     Effort: Pulmonary effort is normal. No respiratory distress.     Breath sounds: Normal breath sounds.  Abdominal:     Palpations: Abdomen is soft.     Tenderness: There is no abdominal tenderness.  Musculoskeletal:        General: No swelling.     Cervical back: Neck supple.  Skin:    General: Skin is warm and dry.     Capillary Refill: Capillary refill takes less than 2 seconds.  Neurological:     Mental Status: He is alert.  Psychiatric:        Mood and Affect: Mood normal.      UC Treatments / Results  Labs (all labs ordered are listed, but only abnormal results are displayed) Labs Reviewed - No data to display  EKG   Radiology No results found.  Procedures Procedures (including critical care time)  Medications Ordered in UC Medications  tetracaine (PONTOCAINE) 0.5 % ophthalmic solution 2 drop (has no administration in time range)    Initial Impression / Assessment and Plan / UC Course  I have reviewed the triage vital signs and the nursing notes.  Pertinent labs & imaging results that were available during my care of the patient were reviewed by me and considered in my medical decision making (see chart for details).     Eye irritation   Fluorescein staining done today which showed no evidence of corneal abrasion, ulcer or scratch on the eye.  The areas of concern do not take up any dye.  Unsure what these areas are but recommend following up with ophthalmology.  Reassuringly the patient has no  loss of vision, pain or visual disturbances.  Recommended that if he  develops these that he should seek emergency care immediately.  Final Clinical Impressions(s) / UC Diagnoses   Final diagnoses:  Eye irritation     Discharge Instructions      Fluorescein staining done today which showed no evidence of corneal abrasion, ulcer or scratch on the eye.  The areas of concern do not take up any dye.  Unsure what these areas are but recommend following up with ophthalmology.  Concerning symptoms would be loss of vision, severe pain, disruption of vision.  If these occur then seek emergency medical care.   ED Prescriptions   None    PDMP not reviewed this encounter.   Kreg Pesa, PA-C 07/28/23 1833    Kreg Pesa, PA-C 07/28/23 1834

## 2023-07-28 NOTE — Discharge Instructions (Addendum)
 Fluorescein staining done today which showed no evidence of corneal abrasion, ulcer or scratch on the eye.  The areas of concern do not take up any dye.  Unsure what these areas are but recommend following up with ophthalmology.  Concerning symptoms would be loss of vision, severe pain, disruption of vision.  If these occur then seek emergency medical care.  Try Buffered eye drops for contact lens over the counter

## 2023-07-28 NOTE — ED Triage Notes (Addendum)
 Pt states that he has had bilateral eye irritation, he thinks he had sand/particles from the air went into his eyes last week. He would also like someone to look at  his eyes and let him know what the white spots are he noticed them recently. He has been using OTC dry eye drops. He states when he sleeps he feels there is something in his eyes.

## 2023-11-17 ENCOUNTER — Encounter (HOSPITAL_COMMUNITY): Payer: Self-pay | Admitting: Emergency Medicine

## 2023-11-17 ENCOUNTER — Ambulatory Visit (HOSPITAL_COMMUNITY): Payer: Self-pay | Admitting: Emergency Medicine

## 2023-11-17 ENCOUNTER — Ambulatory Visit (INDEPENDENT_AMBULATORY_CARE_PROVIDER_SITE_OTHER): Payer: PRIVATE HEALTH INSURANCE

## 2023-11-17 ENCOUNTER — Ambulatory Visit (HOSPITAL_COMMUNITY)
Admission: EM | Admit: 2023-11-17 | Discharge: 2023-11-17 | Disposition: A | Discharge status: Home / Self Care | Payer: Self-pay | Attending: Family Medicine | Admitting: Family Medicine

## 2023-11-17 ENCOUNTER — Other Ambulatory Visit: Payer: Self-pay

## 2023-11-17 DIAGNOSIS — R079 Chest pain, unspecified: Secondary | ICD-10-CM

## 2023-11-17 DIAGNOSIS — R1084 Generalized abdominal pain: Secondary | ICD-10-CM

## 2023-11-17 DIAGNOSIS — Z9189 Other specified personal risk factors, not elsewhere classified: Secondary | ICD-10-CM

## 2023-11-17 DIAGNOSIS — R12 Heartburn: Secondary | ICD-10-CM

## 2023-11-17 LAB — POC SARS CORONAVIRUS 2 AG -  ED: SARS Coronavirus 2 Ag: NEGATIVE

## 2023-11-17 MED ORDER — LANSOPRAZOLE 30 MG PO CPDR: 30.0000 mg | DELAYED_RELEASE_CAPSULE | Freq: Every morning | ORAL | 2 refills | Status: AC

## 2023-11-17 NOTE — ED Provider Notes (Signed)
 [facility]    CSN: [id number] Arrival date & time: 11/17/23  1510    HISTORY  No chief complaint on file.  HPI Jason Vargas is a pleasant, 38 y.o. male who presents to urgent care today. Patient states that for the past few weeks he has been waking up in the middle of the night with sharp pain in the center of his chest.  States he has noticed that he has also been having heartburn more frequently.  Additionally, patient states he has noticed that his bowels have not been moving his freely as usual.  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  Patient states he has not tried medications to alleviate his symptoms.  EKG performed on arrival today showed bradycardia but was otherwise normal.  Patient has normal vital signs on arrival today.   The history is provided by the patient.   Past Medical History:  Diagnosis Date   Chest pain    Hx of echocardiogram    Echo 7/14: EF 60-65%, normal WM   Reflux    Patient Active Problem List   Diagnosis Date Noted   Chest pain, unspecified 10/04/2012   Past Surgical History:  Procedure Laterality Date   APPENDECTOMY      Home Medications    Prior to Admission medications   Medication Sig Start Date End Date Taking? Authorizing Provider  diclofenac  (VOLTAREN ) 75 MG EC tablet Take 1 tablet (75 mg total) by mouth 2 (two) times daily. 01/02/23   Flint Sonny POUR, PA-C    Family History Family History  Problem Relation Age of Onset   Healthy Mother    Healthy Father    Heart attack Neg Hx    Sudden death Neg Hx    Social History Social History   Tobacco Use   Smoking status: Never   Smokeless tobacco: Never  Vaping Use   Vaping status: Never Used  Substance Use Topics   Alcohol use: Yes    Comment: Occassional    Drug use: No   Allergies   Patient has no known allergies.  Review of Systems Review of Systems Pertinent findings revealed after performing a 14  point review of systems has been noted in the history of present illness.  Physical Exam Vital Signs BP 136/89 (BP Location: Right Arm) Comment (BP Location): large cuff  Pulse 69   Temp 98.4 F (36.9 C) (Oral)   Resp 18   SpO2 99%   No data found.  Physical Exam Vitals and nursing note reviewed.  Constitutional:      General: He is awake. He is not in acute distress.    Appearance: Normal appearance. He is well-developed and well-groomed. He is not ill-appearing.  Chest:     Chest wall: No mass, swelling, tenderness, crepitus or edema.  Abdominal:     General: Abdomen is flat. Bowel sounds are decreased.     Palpations: Abdomen is soft.     Tenderness: There is generalized abdominal tenderness. There is no right CVA tenderness or left CVA tenderness.  Neurological:     Mental Status: He is alert.  Psychiatric:        Behavior: Behavior is cooperative.     Visual Acuity Right Eye Distance:   Left Eye Distance:   Bilateral Distance:    Right Eye Near:   Left Eye Near:    Bilateral Near:     UC Couse / Diagnostics / Procedures:  Radiology No results found.  Procedures Procedures (including critical care time) EKG  Pending results:  Labs Reviewed  POC SARS CORONAVIRUS 2 AG -  ED    Medications Ordered in UC: Medications - No data to display  UC Diagnoses / Final Clinical Impressions(s)   I have reviewed the triage vital signs and the nursing notes.  Pertinent labs & imaging results that were available during my care of the patient were reviewed by me and considered in my medical decision making (see chart for details).    Final diagnoses:  Generalized abdominal pain  Heartburn  At increased risk for stress  Chest pain, unspecified type  COVID-19 test today was negative.  EKG was essentially normal.  Patient advised of results of abdominal film which is not overly concerning for constipation.  Patient provided with a prescription for Prevacid  to  relieve his heartburn symptoms.  Patient encouraged to reach out to his primary care provider to discuss his upcoming deployment and anxieties related to this.  Conservative care recommended.  Return precautions advised.  Please see discharge instructions below for details of plan of care as provided to patient. ED Prescriptions     Medication Sig Dispense Auth. Provider   lansoprazole  (PREVACID ) 30 MG capsule Take 1 capsule (30 mg total) by mouth in the morning. 30 minutes prior to breakfast meal 30 capsule Joesph Shaver Scales, PA-C      PDMP not reviewed this encounter.  Pending results:  Labs Reviewed  POC SARS CORONAVIRUS 2 AG -  ED      Discharge Instructions      The x-ray of your abdomen is not overly concerning for constipation.  I do believe that you are experiencing heartburn at this time which is always worse and more noticeable at nighttime.    I recommend treatment with a medication called Prevacid  that I would like for you to take daily for at least the next 2 weeks.  You are welcome to continue it for 30 days if you like.  I have also provided refills if you would like to have some extra on hand for future occurrences.  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  Thank you for your service for our country.  Thank you for visiting [facility] today.  We appreciate the opportunity to participate in your care.      Disposition Upon Discharge:  Condition: stable for discharge home  Patient presented with an acute illness with associated systemic symptoms and significant discomfort requiring urgent management. In my opinion, this is a condition that a prudent lay person (someone who possesses an average knowledge of health and medicine) may potentially expect to result in complications if not addressed urgently such as respiratory distress, impairment of bodily function or dysfunction of bodily organs.   Routine symptom specific, illness  specific and/or disease specific instructions were discussed with the patient and/or caregiver at length.   As such, the patient has been evaluated and assessed, work-up was performed and treatment was provided in alignment with urgent care protocols and evidence based medicine.  Patient/parent/caregiver has been advised that the patient may require follow up for further testing and treatment if the symptoms continue in spite of treatment, as clinically indicated and appropriate.  Patient/parent/caregiver has been advised to return to the [facility] or PCP if no better; to PCP or the Emergency Department if new signs and symptoms develop, or if the current signs or symptoms continue to change or worsen  for further workup, evaluation and treatment as clinically indicated and appropriate  The patient will follow up with their current PCP if and as advised. If the patient does not currently have a PCP we will assist them in obtaining one.   The patient may need specialty follow up if the symptoms continue, in spite of conservative treatment and management, for further workup, evaluation, consultation and treatment as clinically indicated and appropriate.  Patient/parent/caregiver verbalized understanding and agreement of plan as discussed.  All questions were addressed during visit.  Please see discharge instructions below for further details of plan.  This office note has been dictated using Teaching laboratory technician.  Unfortunately, this method of dictation can sometimes lead to typographical or grammatical errors.  I apologize for your inconvenience in advance if this occurs.  Please do not hesitate to reach out to me if clarification is needed.      Joesph Shaver Scales, PA-C 11/18/23 [phone]

## 2023-11-17 NOTE — Discharge Instructions (Addendum)
 The x-ray of your abdomen is not overly concerning for constipation.  I do believe that you are experiencing heartburn at this time which is always worse and more noticeable at nighttime.    I recommend treatment with a medication called Prevacid  that I would like for you to take daily for at least the next 2 weeks.  You are welcome to continue it for 30 days if you like.  I have also provided refills if you would like to have some extra on hand for future occurrences.  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  Thank you for your service for our country.  Thank you for visiting [facility] today.  We appreciate the opportunity to participate in your care.

## 2023-11-17 NOTE — ED Triage Notes (Signed)
 Wakes with center chest pain, burning.  Has had nausea , no vomiting.  Patient has had abdominal discomfort-pointing to center epigastric area.  Has not taken any medicine for symptoms.  Symptoms started 2 days ago  Denies sob.   Has discomfort in chest if he goes to the gym frequently and lifts a lot of weights-reports the current pain is like the pain after lifting weights.
